# Patient Record
Sex: Female | Born: 2000 | Race: White | Hispanic: No | Marital: Single | Smoking: Never smoker
Health system: Southern US, Community
[De-identification: ages and names within clinical notes are randomized; demographics above are authoritative.]

## PROBLEM LIST (undated history)

## (undated) DIAGNOSIS — C801 Malignant (primary) neoplasm, unspecified: Secondary | ICD-10-CM

---
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
## 2019-04-22 ENCOUNTER — Encounter: Payer: Self-pay | Admitting: Emergency Medicine

## 2019-04-22 ENCOUNTER — Emergency Department
Admission: EM | Admit: 2019-04-22 | Discharge: 2019-04-22 | Disposition: A | Discharge status: Home / Self Care | Payer: Medicaid Other | Attending: Emergency Medicine | Admitting: Emergency Medicine

## 2019-04-22 ENCOUNTER — Other Ambulatory Visit: Payer: Self-pay

## 2019-04-22 ENCOUNTER — Emergency Department: Payer: Medicaid Other

## 2019-04-22 DIAGNOSIS — U071 COVID-19: Secondary | ICD-10-CM | POA: Insufficient documentation

## 2019-04-22 DIAGNOSIS — Z3A09 9 weeks gestation of pregnancy: Secondary | ICD-10-CM | POA: Diagnosis not present

## 2019-04-22 DIAGNOSIS — R300 Dysuria: Secondary | ICD-10-CM | POA: Diagnosis not present

## 2019-04-22 DIAGNOSIS — O2691 Pregnancy related conditions, unspecified, first trimester: Secondary | ICD-10-CM | POA: Diagnosis present

## 2019-04-22 DIAGNOSIS — Z85828 Personal history of other malignant neoplasm of skin: Secondary | ICD-10-CM | POA: Diagnosis not present

## 2019-04-22 DIAGNOSIS — O98511 Other viral diseases complicating pregnancy, first trimester: Secondary | ICD-10-CM | POA: Insufficient documentation

## 2019-04-22 DIAGNOSIS — J069 Acute upper respiratory infection, unspecified: Secondary | ICD-10-CM

## 2019-04-22 DIAGNOSIS — Z3491 Encounter for supervision of normal pregnancy, unspecified, first trimester: Secondary | ICD-10-CM

## 2019-04-22 HISTORY — DX: Malignant (primary) neoplasm, unspecified: C80.1

## 2019-04-22 LAB — HCG, QUANTITATIVE, PREGNANCY: hCG, Beta Chain, Quant, S: 127430 m[IU]/mL — ABNORMAL HIGH (ref <5)

## 2019-04-22 LAB — COMPREHENSIVE METABOLIC PANEL
ALT: 24 U/L (ref 0–44)
AST: 23 U/L (ref 15–41)
Albumin: 4 g/dL (ref 3.5–5.0)
Alkaline Phosphatase: 38 U/L (ref 38–126)
Anion gap: 10 (ref 5–15)
BUN: 11 mg/dL (ref 6–20)
CO2: 22 mmol/L (ref 22–32)
Calcium: 8.9 mg/dL (ref 8.9–10.3)
Chloride: 105 mmol/L (ref 98–111)
Creatinine, Ser: 0.63 mg/dL (ref 0.44–1.00)
GFR calc Af Amer: >60 mL/min (ref >60)
GFR calc non Af Amer: >60 mL/min (ref >60)
Glucose, Bld: 77 mg/dL (ref 70–99)
Potassium: 3.6 mmol/L (ref 3.5–5.1)
Sodium: 137 mmol/L (ref 135–145)
Total Bilirubin: 0.4 mg/dL (ref 0.3–1.2)
Total Protein: 7.3 g/dL (ref 6.5–8.1)

## 2019-04-22 LAB — CBC
HCT: 34.6 % — ABNORMAL LOW (ref 36.0–46.0)
Hemoglobin: 12.3 g/dL (ref 12.0–15.0)
MCH: 29.4 pg (ref 26.0–34.0)
MCHC: 35.5 g/dL (ref 30.0–36.0)
MCV: 82.8 fL (ref 80.0–100.0)
Platelets: 163 10*3/uL (ref 150–400)
RBC: 4.18 MIL/uL (ref 3.87–5.11)
RDW: 12.8 % (ref 11.5–15.5)
WBC: 7.3 10*3/uL (ref 4.0–10.5)
nRBC: 0 % (ref 0.0–0.2)

## 2019-04-22 LAB — URINALYSIS, COMPLETE (UACMP) WITH MICROSCOPIC
Bacteria, UA: NONE SEEN
Bilirubin Urine: NEGATIVE
Glucose, UA: NEGATIVE mg/dL
Hgb urine dipstick: NEGATIVE
Ketones, ur: 20 mg/dL — AB
Leukocytes,Ua: NEGATIVE
Nitrite: NEGATIVE
Protein, ur: NEGATIVE mg/dL
Specific Gravity, Urine: 1.025 (ref 1.005–1.030)
pH: 6 (ref 5.0–8.0)

## 2019-04-22 LAB — INFLUENZA PANEL BY PCR (TYPE A & B)
Influenza A By PCR: NEGATIVE
Influenza B By PCR: NEGATIVE

## 2019-04-22 LAB — POCT PREGNANCY, URINE: Preg Test, Ur: POSITIVE — AB

## 2019-04-22 LAB — GROUP A STREP BY PCR: Group A Strep by PCR: NOT DETECTED

## 2019-04-22 IMAGING — US US OB COMP LESS 14 WK
1 series · 14 of 28 positions shown · non-contrast
Comparison: None.

CLINICAL DATA: Initial evaluation for acute abdominal pain, early
pregnancy.

EXAM:
OBSTETRIC <14 WK ULTRASOUND
TECHNIQUE: Transabdominal ultrasound was performed for evaluation of the
gestation as well as the maternal uterus and adnexal regions.

[Series 1: us ob comp less 14 wk · 14 of 82 slices shown]
[im 4/82]
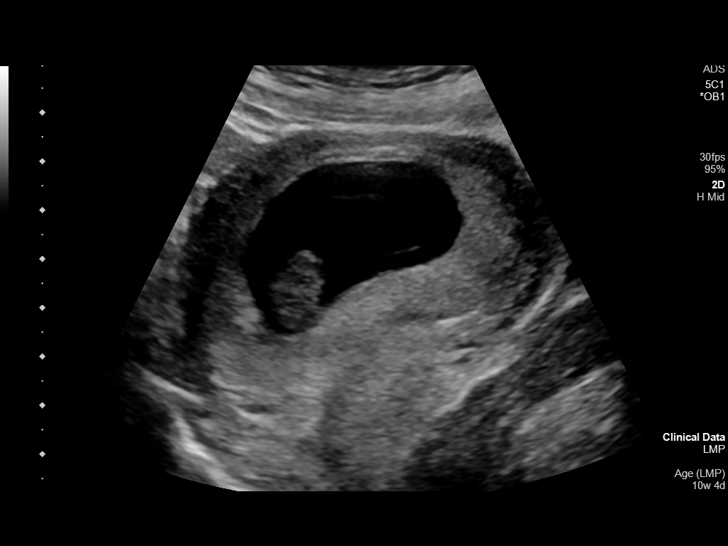
[im 10/82]
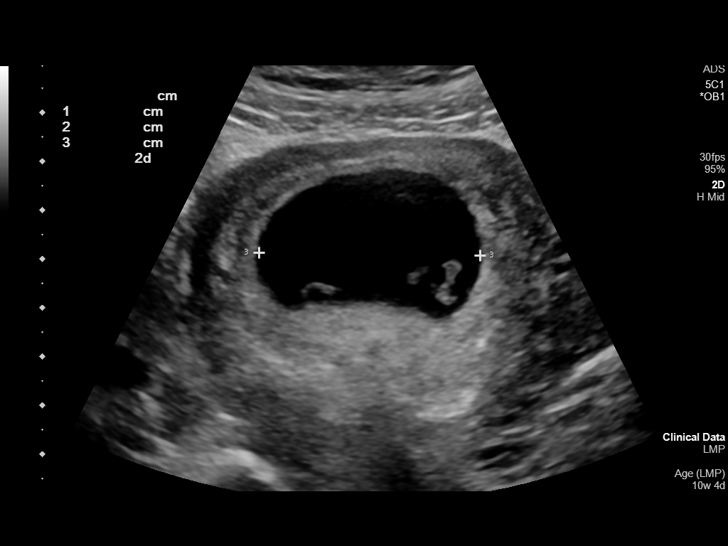
[im 16/82]
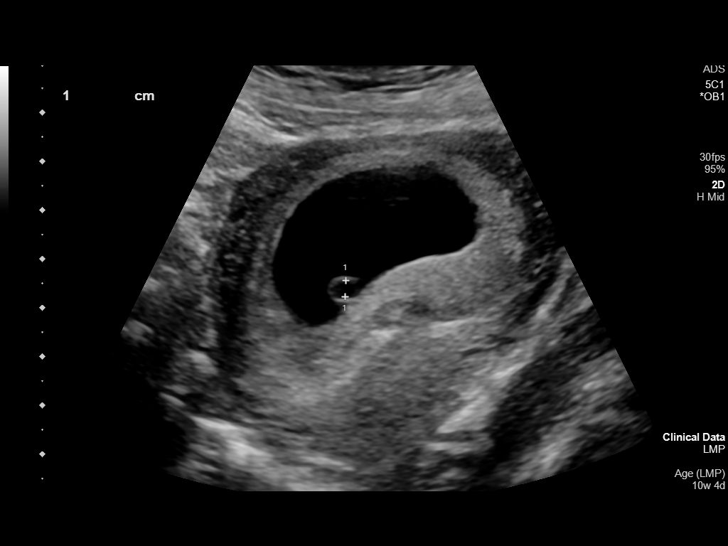
[im 22/82]
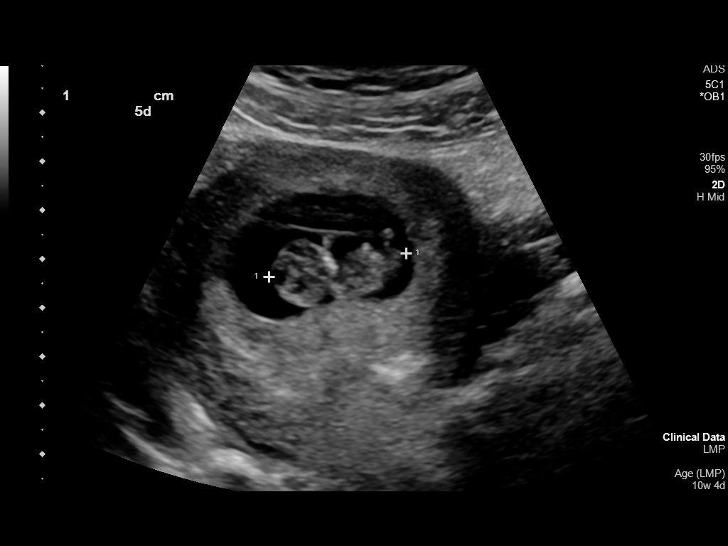
[im 28/82]
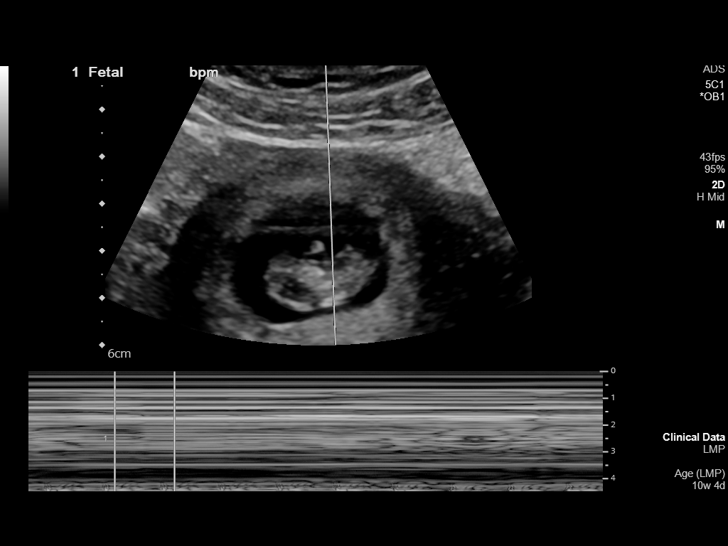
[im 34/82]
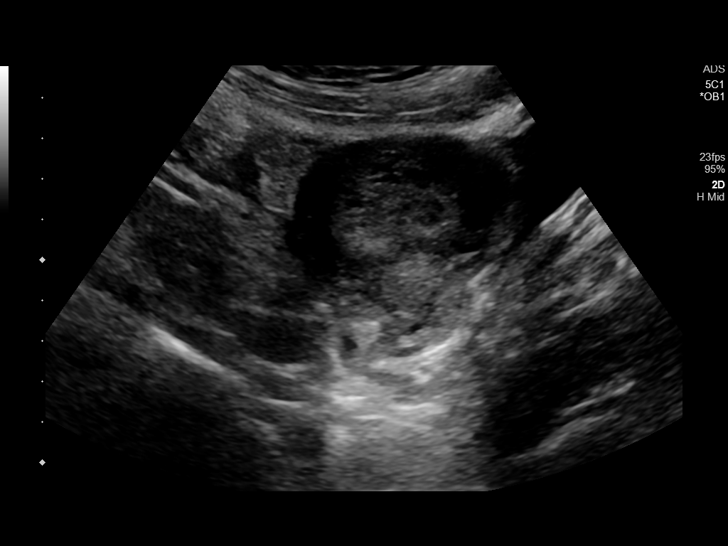
[im 40/82]
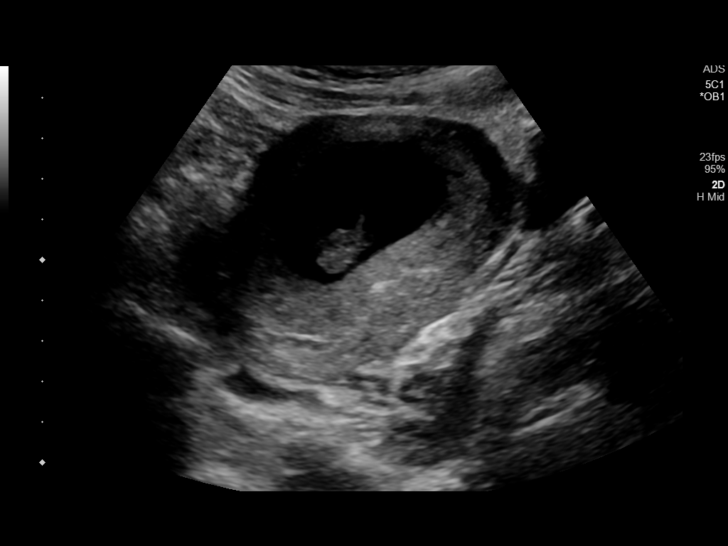
[im 46/82]
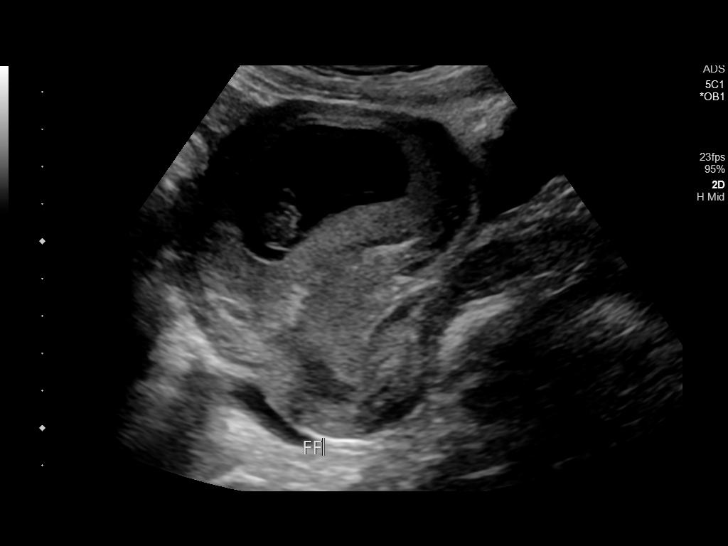
[im 52/82]
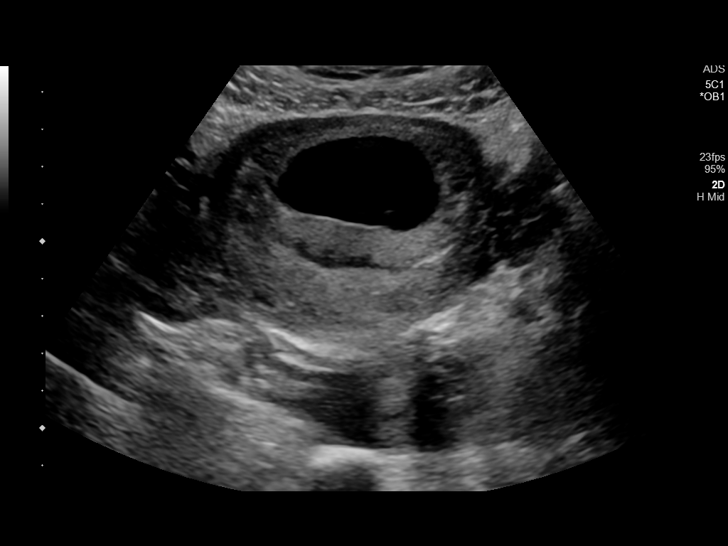
[im 58/82]
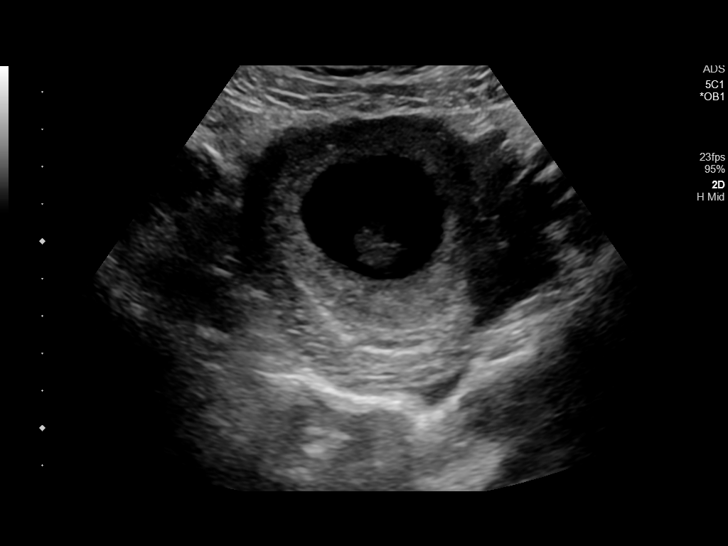
[im 64/82]
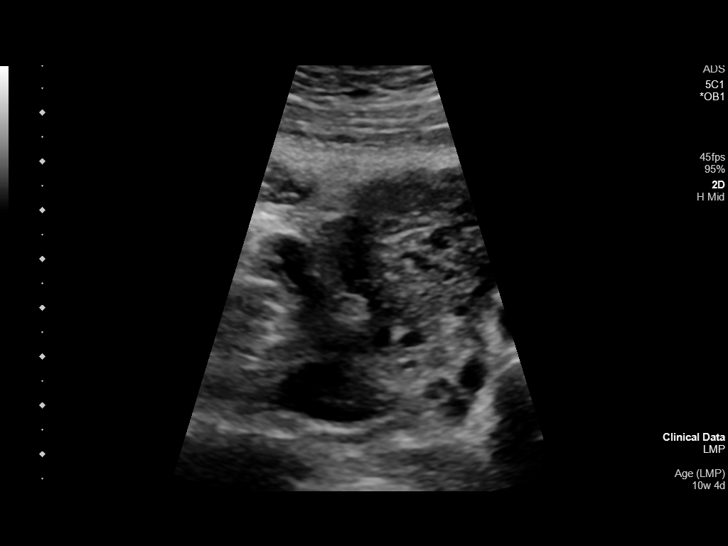
[im 70/82]
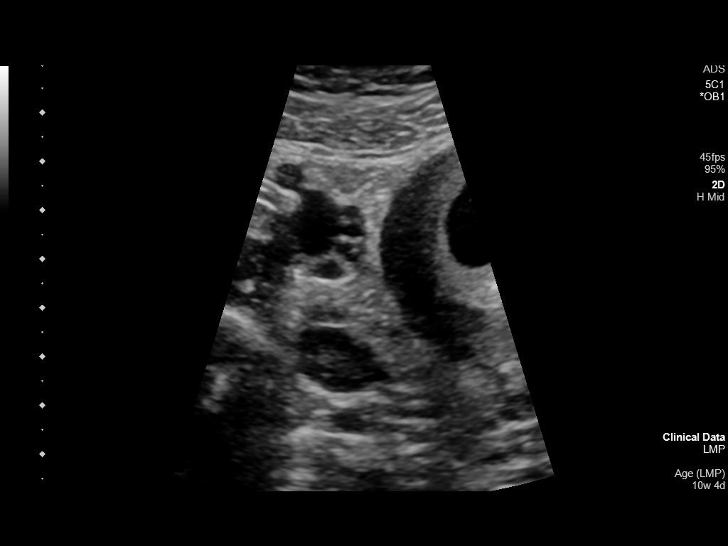
[im 76/82]
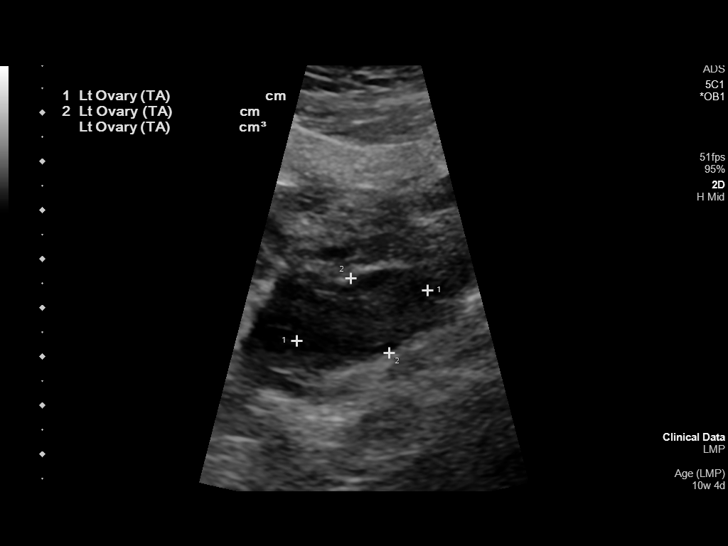
[im 82/82]
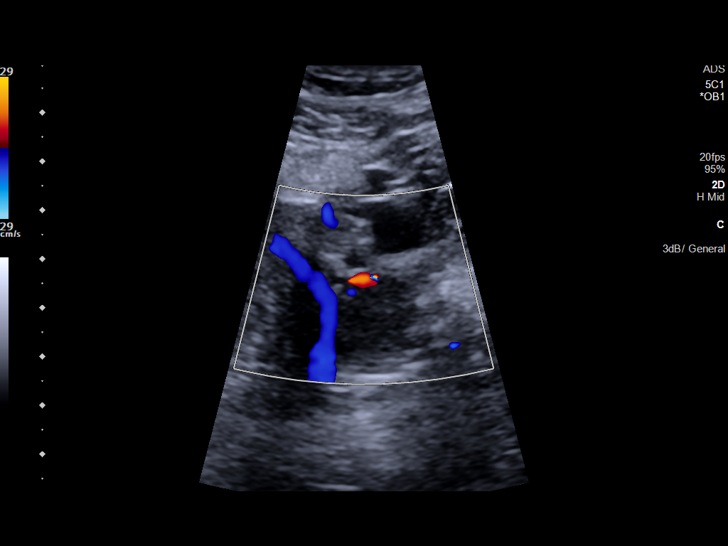

[14 of 28 positions shown; findings below may reference images not displayed]

FINDINGS: Intrauterine gestational sac: Single

Yolk sac:  Present

Embryo:  Present

Cardiac Activity: Present

Heart Rate: 173 bpm

CRL: 28.0 mm   9 w 4 d                  US EDC: [DATE]

Subchorionic hemorrhage:  None visualized.

Maternal uterus/adnexae: Ovaries are normal in appearance
bilaterally. No adnexal mass. Trace free fluid noted within the
pelvis.
IMPRESSION: 1. Single viable intrauterine pregnancy as above, estimated
gestational age 9 weeks and 4 days by crown-rump length, with
ultrasound EDC of [DATE]. No complication.
2. No other acute maternal uterine or adnexal abnormality
identified.

## 2019-04-22 MED ORDER — ACETAMINOPHEN 500 MG PO TABS: 500.0000 mg | ORAL_TABLET | Freq: Four times a day (QID) | ORAL | 0 refills | Status: DC | PRN

## 2019-04-22 NOTE — ED Provider Notes (Signed)
[facility] Emergency Department Provider Note  ____________________________________________  Time seen: Approximately 4:12 PM  I have reviewed the triage vital signs and the nursing notes.   HISTORY  Chief Complaint COVID s/sx    HPI Cynthia Stevenson is a 18 y.o. female that presents to the emergency department for evaluation of nasal congestion and sore throat for 2 days and fever last night with fatigue today.  She feels like she has a cold.  Fever last night was 100.4.  She noticed last night that the soda that she was drinking, she could not taste and that made her concerned that she could have Covid 19 so she came to the emergency department to get tested.  She was going to get tested tomorrow but wanted to get tested sooner.  She did have an episode of abdominal soreness to her right side earlier.  Discomfort was to her side and not over the front of her abdomen.  No additional abdominal pain or cramping.  She has also noticed some dysuria.  Patient states that she is [redacted] weeks pregnant and is scheduled to have her first OB visit on Friday.  She is taking prenatal vitamins.  She took Tylenol prior to coming to the emergency department.  No vaginal bleeding.   Past Medical History:  Diagnosis Date  . Cancer ([place])    melanoma 2016    There are no problems to display for this patient.   History reviewed. No pertinent surgical history.  Prior to Admission medications   Medication Sig Start Date End Date Taking? Authorizing Provider  acetaminophen (TYLENOL) 500 MG tablet Take 1 tablet (500 mg total) by mouth every 6 (six) hours as needed. 04/22/19   Laban Emperor, PA-C    Allergies Patient has no known allergies.  History reviewed. No pertinent family history.  Social History Social History   Tobacco Use  . Smoking status: Never Smoker  . Smokeless tobacco: Never Used  Substance Use Topics  . Alcohol use: Not on file  . Drug use: Not on file      Review of Systems  Constitutional: Positive for low grade fever. Eyes: No visual changes. No discharge. ENT: Positive for congestion and rhinorrhea. Positive for sore throat. Cardiovascular: No chest pain. Respiratory: Positive for intermittent cough. No SOB. Gastrointestinal: No recent abdominal pain.  No nausea, no vomiting.  No diarrhea.  No constipation. Musculoskeletal: Negative for musculoskeletal pain. Skin: Negative for rash, abrasions, lacerations, ecchymosis. Neurological: Negative for headaches.   ____________________________________________   PHYSICAL EXAM:  VITAL SIGNS: ED Triage Vitals  Enc Vitals Group     BP 04/22/19 1515 115/68     Pulse Rate 04/22/19 1515 98     Resp 04/22/19 1515 18     Temp 04/22/19 1515 98.8 F (37.1 C)     Temp Source 04/22/19 1515 Oral     SpO2 04/22/19 1515 99 %     Weight 04/22/19 1514 130 lb (59 kg)     Height 04/22/19 1514 5\' 2"  (1.575 m)     Head Circumference --      Peak Flow --      Pain Score 04/22/19 1516 0     Pain Loc --      Pain Edu? --      Excl. in [place]? --      Constitutional: Alert and oriented. Well appearing and in no acute distress. Eyes: Conjunctivae are normal. PERRL. EOMI. No discharge. Head: Atraumatic. ENT: No frontal and maxillary  sinus tenderness.      Ears: Tympanic membranes pearly gray with good landmarks. No discharge.      Nose: Mild congestion/rhinnorhea.      Mouth/Throat: Mucous membranes are moist. Oropharynx non-erythematous. Tonsils not enlarged. No exudates. Uvula midline. Neck: No stridor.   Hematological/Lymphatic/Immunilogical: No cervical lymphadenopathy. Cardiovascular: Normal rate, regular rhythm.  Good peripheral circulation. Respiratory: Normal respiratory effort without tachypnea or retractions. Lungs CTAB. Good air entry to the bases with no decreased or absent breath sounds. Gastrointestinal: Bowel sounds 4 quadrants. Soft and nontender to palpation. No guarding or  rigidity. No palpable masses. No distention. Musculoskeletal: Full range of motion to all extremities. No gross deformities appreciated. Neurologic:  Normal speech and language. No gross focal neurologic deficits are appreciated.  Skin:  Skin is warm, dry and intact. No rash noted. Psychiatric: Mood and affect are normal. Speech and behavior are normal. Patient exhibits appropriate insight and judgement.   ____________________________________________   LABS (all labs ordered are listed, but only abnormal results are displayed)  Labs Reviewed  CBC - Abnormal; Notable for the following components:      Result Value   HCT 34.6 (*)    All other components within normal limits  HCG, QUANTITATIVE, PREGNANCY - Abnormal; Notable for the following components:   hCG, Beta Chain, Quant, S 127,430 (*)    All other components within normal limits  URINALYSIS, COMPLETE (UACMP) WITH MICROSCOPIC - Abnormal; Notable for the following components:   Color, Urine YELLOW (*)    APPearance CLEAR (*)    Ketones, ur 20 (*)    All other components within normal limits  POCT PREGNANCY, URINE - Abnormal; Notable for the following components:   Preg Test, Ur POSITIVE (*)    All other components within normal limits  GROUP A STREP BY PCR  SARS CORONAVIRUS 2 (TAT 6-24 HRS)  URINE CULTURE  COMPREHENSIVE METABOLIC PANEL  INFLUENZA PANEL BY PCR (TYPE A & B)  POC SARS CORONAVIRUS 2 AG -  ED   ____________________________________________  EKG   ____________________________________________  RADIOLOGY   US OB Comp Less 14 Wks  Result Date: 04/22/2019 CLINICAL DATA:  Initial evaluation for acute abdominal pain, early pregnancy. EXAM: OBSTETRIC <14 WK ULTRASOUND TECHNIQUE: Transabdominal ultrasound was performed for evaluation of the gestation as well as the maternal uterus and adnexal regions. COMPARISON:  None. FINDINGS: Intrauterine gestational sac: Single Yolk sac:  Present Embryo:  Present Cardiac  Activity: Present Heart Rate: 173 bpm CRL: 28.0 mm   9 w 4 d                  [place] EDC: 11/21/2019 Subchorionic hemorrhage:  None visualized. Maternal uterus/adnexae: Ovaries are normal in appearance bilaterally. No adnexal mass. Trace free fluid noted within the pelvis. IMPRESSION: 1. Single viable intrauterine pregnancy as above, estimated gestational age [redacted] weeks and 4 days by crown-rump length, with ultrasound EDC of 11/21/2019. No complication. 2. No other acute maternal uterine or adnexal abnormality identified. Electronically Signed   By: Jeannine Boga M.D.   On: 04/22/2019 18:40    ____________________________________________    PROCEDURES  Procedure(s) performed:    Procedures    Medications - No data to display   ____________________________________________   INITIAL IMPRESSION / ASSESSMENT AND PLAN / ED COURSE  Pertinent labs & imaging results that were available during my care of the patient were reviewed by me and considered in my medical decision making (see chart for details).  Review of the [place] CSRS  was performed in accordance of the [place] prior to dispensing any controlled drugs.   Patient's diagnosis is consistent with viral illness. Vital signs and exam are reassuring.  Triage note states that patient had a temperature of 101.5 but patient clarifies that she did not have a temperature this high and highest that she is aware of was 100.4.  POC Covid test is negative.  Covid PCR test is pending.  Strep is negative. Influenza test is negative. CBC and CMP is largely unremarkable.  No infection on urinalysis.  Beta hCG 127,430. Ultrasound consistent with single viable intrauterine pregnancy with gestational age [redacted] weeks and 4 days without complication. HR 173bpm.  Symptoms are consistent with a viral URI.  Patient appears well and is staying well hydrated.  Patient can continue Tylenol for fever.  Patient feels comfortable going home. Patient will be discharged home with  prescriptions for Tylenol. Patient is to follow up with gynecology as needed or otherwise directed. Patient is given ED precautions to return to the ED for any worsening or new symptoms.  Patient's PCR Covid resulted the next morning as positive.  I have attempted to call the patient twice today without response.  Shaniese Cascino was evaluated in Emergency Department on 04/22/2019 for the symptoms described in the history of present illness. She was evaluated in the context of the global COVID-19 pandemic, which necessitated consideration that the patient might be at risk for infection with the SARS-CoV-2 virus that causes COVID-19. Institutional protocols and algorithms that pertain to the evaluation of patients at risk for COVID-19 are in a state of rapid change based on information released by regulatory bodies including the CDC and federal and state organizations. These policies and algorithms were followed during the patient's care in the ED.   ____________________________________________  FINAL CLINICAL IMPRESSION(S) / ED DIAGNOSES  Final diagnoses:  First trimester pregnancy  Viral URI      NEW MEDICATIONS STARTED DURING THIS VISIT:  ED Discharge Orders         Ordered    acetaminophen (TYLENOL) 500 MG tablet  Every 6 hours PRN     04/22/19 1856              This chart was dictated using voice recognition software/Dragon. Despite best efforts to proofread, errors can occur which can change the meaning. Any change was purely unintentional.    Laban Emperor, PA-C 04/23/19 1459    Vanessa Cusick, MD 04/23/19 1536

## 2019-04-22 NOTE — ED Triage Notes (Signed)
Pt presents to ED via POV c/o temp 101.5 at home, took tylenol 1hr PTA. Temp in triage 98.8. pt reports fatigue, malaise, loss of taste x2 days. States she is [redacted]wks pregnant, was suppose to have first OB visit this Friday but canceled d/t COVID symptoms. NAD.

## 2019-04-22 NOTE — Discharge Instructions (Addendum)
Your ultrasound shows a normal intrauterine pregnancy without complication.  There is no infection on your urine.  Your lab work is all reassuring.  Your strep test is negative.  Your initial Covid test was negative but final Covid results should be available tomorrow.  You likely have a viral cold.  Please return to the emergency department for high fever, shortness of breath, chest pain, abdominal pain, vaginal bleeding or any other symptoms concerning to you.  Please follow-up with your OB appointment on Friday.

## 2019-04-22 NOTE — ED Notes (Signed)
Unable to find fetal heart tones. EDP [place] notified. Pt to go to [place]

## 2019-04-22 NOTE — ED Notes (Signed)
covid negative poct swab.

## 2019-04-22 NOTE — ED Notes (Signed)

## 2019-04-22 NOTE — ED Notes (Signed)
2nd swab sent for flu

## 2019-04-22 NOTE — ED Notes (Signed)
[place] notified hcg quant drawn and sent

## 2019-04-23 LAB — URINE CULTURE: Culture: NO GROWTH

## 2019-04-23 LAB — SARS CORONAVIRUS 2 (TAT 6-24 HRS): SARS Coronavirus 2: POSITIVE — AB

## 2019-04-24 ENCOUNTER — Telehealth: Payer: Self-pay | Admitting: Emergency Medicine

## 2019-04-24 NOTE — Telephone Encounter (Signed)
Called patient to inform of positive covid 19 result.  Explained cdc guidelines for isolation and quarantine.

## 2019-04-25 ENCOUNTER — Encounter: Payer: Self-pay | Admitting: Advanced Practice Midwife

## 2019-04-28 ENCOUNTER — Telehealth: Payer: Self-pay

## 2019-04-28 NOTE — Telephone Encounter (Signed)
Covid test was positive on 04/22/2019 according to [facility] labs.  Let her know, and someone from hospital should be letting her know if not already.  Stay home and avoid exposing others for 10 days starting from the 15th. Ultrasound was done on the 15th as well and does not need repeating as of yet.  She does need additional prenatal labs to be done at her next appt here.

## 2019-04-28 NOTE — Telephone Encounter (Signed)
Pt is scheduled on 05/15/2019 NOB with [facility]. She was recently seen in the ER for COVID. Lab work was done at that time. Pt wanting to know if she can get a call with results and also, since labs done can she go ahead and get an ultrasound scheduled?

## 2019-04-28 NOTE — Telephone Encounter (Signed)
Pt aware.

## 2019-05-16 ENCOUNTER — Telehealth: Payer: Self-pay | Admitting: Obstetrics & Gynecology

## 2019-05-16 ENCOUNTER — Ambulatory Visit (INDEPENDENT_AMBULATORY_CARE_PROVIDER_SITE_OTHER): Payer: Medicaid Other | Admitting: Obstetrics & Gynecology

## 2019-05-16 ENCOUNTER — Other Ambulatory Visit (HOSPITAL_COMMUNITY)
Admission: RE | Admit: 2019-05-16 | Discharge: 2019-05-16 | Disposition: A | Discharge status: Home / Self Care | Payer: Medicaid Other | Source: Ambulatory Visit | Attending: Obstetrics & Gynecology | Admitting: Obstetrics & Gynecology

## 2019-05-16 ENCOUNTER — Encounter: Payer: Self-pay | Admitting: Obstetrics & Gynecology

## 2019-05-16 ENCOUNTER — Other Ambulatory Visit: Payer: Self-pay

## 2019-05-16 VITALS — BP 120/80 | Wt 127.0 lb

## 2019-05-16 DIAGNOSIS — Z113 Encounter for screening for infections with a predominantly sexual mode of transmission: Secondary | ICD-10-CM

## 2019-05-16 DIAGNOSIS — Z3490 Encounter for supervision of normal pregnancy, unspecified, unspecified trimester: Secondary | ICD-10-CM | POA: Insufficient documentation

## 2019-05-16 DIAGNOSIS — Z3A13 13 weeks gestation of pregnancy: Secondary | ICD-10-CM | POA: Diagnosis not present

## 2019-05-16 DIAGNOSIS — Z3401 Encounter for supervision of normal first pregnancy, first trimester: Secondary | ICD-10-CM

## 2019-05-16 DIAGNOSIS — Z3402 Encounter for supervision of normal first pregnancy, second trimester: Secondary | ICD-10-CM | POA: Insufficient documentation

## 2019-05-16 DIAGNOSIS — Z1379 Encounter for other screening for genetic and chromosomal anomalies: Secondary | ICD-10-CM

## 2019-05-16 NOTE — Telephone Encounter (Signed)
When patients materniti 19 lab work comes back please call Ices Kamerer (pts mom) at [phone].

## 2019-05-16 NOTE — Patient Instructions (Signed)
Commonly Asked Questions During Pregnancy  Cats: A parasite can be excreted in cat feces.  To avoid exposure you need to have another person empty the little box.  If you must empty the litter box you will need to wear gloves.  Wash your hands after handling your cat.  This parasite can also be found in raw or undercooked meat so this should also be avoided.  Colds, Sore Throats, Flu: Please check your medication sheet to see what you can take for symptoms.  If your symptoms are unrelieved by these medications please call the office.  Dental Work: Most any dental work your dentist recommends is permitted.  X-rays should only be taken during the first trimester if absolutely necessary.  Your abdomen should be shielded with a lead apron during all x-rays.  Please notify your provider prior to receiving any x-rays.  Novocaine is fine; gas is not recommended.  If your dentist requires a note from us prior to dental work please call the office and we will provide one for you.  Exercise: Exercise is an important part of staying healthy during your pregnancy.  You may continue most exercises you were accustomed to prior to pregnancy.  Later in your pregnancy you will most likely notice you have difficulty with activities requiring balance like riding a bicycle.  It is important that you listen to your body and avoid activities that put you at a higher risk of falling.  Adequate rest and staying well hydrated are a must!  If you have questions about the safety of specific activities ask your provider.    Exposure to Children with illness: Try to avoid obvious exposure; report any symptoms to us when noted,  If you have chicken pos, red measles or mumps, you should be immune to these diseases.   Please do not take any vaccines while pregnant unless you have checked with your OB provider.  Fetal Movement: After 28 weeks we recommend you do "kick counts" twice daily.  Lie or sit down in a calm quiet environment and  count your baby movements "kicks".  You should feel your baby at least 10 times per hour.  If you have not felt 10 kicks within the first hour get up, walk around and have something sweet to eat or drink then repeat for an additional hour.  If count remains less than 10 per hour notify your provider.  Fumigating: Follow your pest control agent's advice as to how long to stay out of your home.  Ventilate the area well before re-entering.  Hemorrhoids:   Most over-the-counter preparations can be used during pregnancy.  Check your medication to see what is safe to use.  It is important to use a stool softener or fiber in your diet and to drink lots of liquids.  If hemorrhoids seem to be getting worse please call the office.   Hot Tubs:  Hot tubs Jacuzzis and saunas are not recommended while pregnant.  These increase your internal body temperature and should be avoided.  Intercourse:  Sexual intercourse is safe during pregnancy as long as you are comfortable, unless otherwise advised by your provider.  Spotting may occur after intercourse; report any bright red bleeding that is heavier than spotting.  Labor:  If you know that you are in labor, please go to the hospital.  If you are unsure, please call the office and let us help you decide what to do.  Lifting, straining, etc:  If your job requires heavy   lifting or straining please check with your provider for any limitations.  Generally, you should not lift items heavier than that you can lift simply with your hands and arms (no back muscles)  Painting:  Paint fumes do not harm your pregnancy, but may make you ill and should be avoided if possible.  Latex or water based paints have less odor than oils.  Use adequate ventilation while painting.  Permanents & Hair Color:  Chemicals in hair dyes are not recommended as they cause increase hair dryness which can increase hair loss during pregnancy.  " Highlighting" and permanents are allowed.  Dye may be  absorbed differently and permanents may not hold as well during pregnancy.  Sunbathing:  Use a sunscreen, as skin burns easily during pregnancy.  Drink plenty of fluids; avoid over heating.  Tanning Beds:  Because their possible side effects are still unknown, tanning beds are not recommended.  Ultrasound Scans:  Routine ultrasounds are performed at approximately 20 weeks.  You will be able to see your baby's general anatomy an if you would like to know the gender this can usually be determined as well.  If it is questionable when you conceived you may also receive an ultrasound early in your pregnancy for dating purposes.  Otherwise ultrasound exams are not routinely performed unless there is a medical necessity.  Although you can request a scan we ask that you pay for it when conducted because insurance does not cover " patient request" scans.  Work: If your pregnancy proceeds without complications you may work until your due date, unless your physician or employer advises otherwise.  Round Ligament Pain/Pelvic Discomfort:  Sharp, shooting pains not associated with bleeding are fairly common, usually occurring in the second trimester of pregnancy.  They tend to be worse when standing up or when you remain standing for long periods of time.  These are the result of pressure of certain pelvic ligaments called "round ligaments".  Rest, Tylenol and heat seem to be the most effective relief.  As the womb and fetus grow, they rise out of the pelvis and the discomfort improves.  Please notify the office if your pain seems different than that described.  It may represent a more serious condition.    First Trimester of Pregnancy The first trimester of pregnancy is from week 1 until the end of week 13 (months 1 through 3). A week after a sperm fertilizes an egg, the egg will implant on the wall of the uterus. This embryo will begin to develop into a baby. Genes from you and your partner will form the baby.  The female genes will determine whether the baby will be a boy or a girl. At 6-8 weeks, the eyes and face will be formed, and the heartbeat can be seen on ultrasound. At the end of 12 weeks, all the baby's organs will be formed. Now that you are pregnant, you will want to do everything you can to have a healthy baby. Two of the most important things are to get good prenatal care and to follow your health care provider's instructions. Prenatal care is all the medical care you receive before the baby's birth. This care will help prevent, find, and treat any problems during the pregnancy and childbirth. Body changes during your first trimester Your body goes through many changes during pregnancy. The changes vary from woman to woman.  You may gain or lose a couple of pounds at first.  You may feel sick to  your stomach (nauseous) and you may throw up (vomit). If the vomiting is uncontrollable, call your health care provider.  You may tire easily.  You may develop headaches that can be relieved by medicines. All medicines should be approved by your health care provider.  You may urinate more often. Painful urination may mean you have a bladder infection.  You may develop heartburn as a result of your pregnancy.  You may develop constipation because certain hormones are causing the muscles that push stool through your intestines to slow down.  You may develop hemorrhoids or swollen veins (varicose veins).  Your breasts may begin to grow larger and become tender. Your nipples may stick out more, and the tissue that surrounds them (areola) may become darker.  Your gums may bleed and may be sensitive to brushing and flossing.  Dark spots or blotches (chloasma, mask of pregnancy) may develop on your face. This will likely fade after the baby is born.  Your menstrual periods will stop.  You may have a loss of appetite.  You may develop cravings for certain kinds of food.  You may have changes in  your emotions from day to day, such as being excited to be pregnant or being concerned that something may go wrong with the pregnancy and baby.  You may have more vivid and strange dreams.  You may have changes in your hair. These can include thickening of your hair, rapid growth, and changes in texture. Some women also have hair loss during or after pregnancy, or hair that feels dry or thin. Your hair will most likely return to normal after your baby is born. What to expect at prenatal visits During a routine prenatal visit:  You will be weighed to make sure you and the baby are growing normally.  Your blood pressure will be taken.  Your abdomen will be measured to track your baby's growth.  The fetal heartbeat will be listened to between weeks 10 and 14 of your pregnancy.  Test results from any previous visits will be discussed. Your health care provider may ask you:  How you are feeling.  If you are feeling the baby move.  If you have had any abnormal symptoms, such as leaking fluid, bleeding, severe headaches, or abdominal cramping.  If you are using any tobacco products, including cigarettes, chewing tobacco, and electronic cigarettes.  If you have any questions. Other tests that may be performed during your first trimester include:  Blood tests to find your blood type and to check for the presence of any previous infections. The tests will also be used to check for low iron levels (anemia) and protein on red blood cells (Rh antibodies). Depending on your risk factors, or if you previously had diabetes during pregnancy, you may have tests to check for high blood sugar that affects pregnant women (gestational diabetes).  Urine tests to check for infections, diabetes, or protein in the urine.  An ultrasound to confirm the proper growth and development of the baby.  Fetal screens for spinal cord problems (spina bifida) and Down syndrome.  HIV (human immunodeficiency virus)  testing. Routine prenatal testing includes screening for HIV, unless you choose not to have this test.  You may need other tests to make sure you and the baby are doing well. Follow these instructions at home: Medicines  Follow your health care provider's instructions regarding medicine use. Specific medicines may be either safe or unsafe to take during pregnancy.  Take a prenatal vitamin that  contains at least 600 micrograms (mcg) of folic acid.  If you develop constipation, try taking a stool softener if your health care provider approves. Eating and drinking   Eat a balanced diet that includes fresh fruits and vegetables, whole grains, good sources of protein such as meat, eggs, or tofu, and low-fat dairy. Your health care provider will help you determine the amount of weight gain that is right for you.  Avoid raw meat and uncooked cheese. These carry germs that can cause birth defects in the baby.  Eating four or five small meals rather than three large meals a day may help relieve nausea and vomiting. If you start to feel nauseous, eating a few soda crackers can be helpful. Drinking liquids between meals, instead of during meals, also seems to help ease nausea and vomiting.  Limit foods that are high in fat and processed sugars, such as fried and sweet foods.  To prevent constipation: ? Eat foods that are high in fiber, such as fresh fruits and vegetables, whole grains, and beans. ? Drink enough fluid to keep your urine clear or pale yellow. Activity  Exercise only as directed by your health care provider. Most women can continue their usual exercise routine during pregnancy. Try to exercise for 30 minutes at least 5 days a week. Exercising will help you: ? Control your weight. ? Stay in shape. ? Be prepared for labor and delivery.  Experiencing pain or cramping in the lower abdomen or lower back is a good sign that you should stop exercising. Check with your health care provider  before continuing with normal exercises.  Try to avoid standing for long periods of time. Move your legs often if you must stand in one place for a long time.  Avoid heavy lifting.  Wear low-heeled shoes and practice good posture.  You may continue to have sex unless your health care provider tells you not to. Relieving pain and discomfort  Wear a good support bra to relieve breast tenderness.  Take warm sitz baths to soothe any pain or discomfort caused by hemorrhoids. Use hemorrhoid cream if your health care provider approves.  Rest with your legs elevated if you have leg cramps or low back pain.  If you develop varicose veins in your legs, wear support hose. Elevate your feet for 15 minutes, 3-4 times a day. Limit salt in your diet. Prenatal care  Schedule your prenatal visits by the twelfth week of pregnancy. They are usually scheduled monthly at first, then more often in the last 2 months before delivery.  Write down your questions. Take them to your prenatal visits.  Keep all your prenatal visits as told by your health care provider. This is important. Safety  Wear your seat belt at all times when driving.  Make a list of emergency phone numbers, including numbers for family, friends, the hospital, and police and fire departments. General instructions  Ask your health care provider for a referral to a local prenatal education class. Begin classes no later than the beginning of month 6 of your pregnancy.  Ask for help if you have counseling or nutritional needs during pregnancy. Your health care provider can offer advice or refer you to specialists for help with various needs.  Do not use hot tubs, steam rooms, or saunas.  Do not douche or use tampons or scented sanitary pads.  Do not cross your legs for long periods of time.  Avoid cat litter boxes and soil used by cats. These  carry germs that can cause birth defects in the baby and possibly loss of the fetus by  miscarriage or stillbirth.  Avoid all smoking, herbs, alcohol, and medicines not prescribed by your health care provider. Chemicals in these products affect the formation and growth of the baby.  Do not use any products that contain nicotine or tobacco, such as cigarettes and e-cigarettes. If you need help quitting, ask your health care provider. You may receive counseling support and other resources to help you quit.  Schedule a dentist appointment. At home, brush your teeth with a soft toothbrush and be gentle when you floss. Contact a health care provider if:  You have dizziness.  You have mild pelvic cramps, pelvic pressure, or nagging pain in the abdominal area.  You have persistent nausea, vomiting, or diarrhea.  You have a bad smelling vaginal discharge.  You have pain when you urinate.  You notice increased swelling in your face, hands, legs, or ankles.  You are exposed to fifth disease or chickenpox.  You are exposed to [place] measles (rubella) and have never had it. Get help right away if:  You have a fever.  You are leaking fluid from your vagina.  You have spotting or bleeding from your vagina.  You have severe abdominal cramping or pain.  You have rapid weight gain or loss.  You vomit blood or material that looks like coffee grounds.  You develop a severe headache.  You have shortness of breath.  You have any kind of trauma, such as from a fall or a car accident. Summary  The first trimester of pregnancy is from week 1 until the end of week 13 (months 1 through 3).  Your body goes through many changes during pregnancy. The changes vary from woman to woman.  You will have routine prenatal visits. During those visits, your health care provider will examine you, discuss any test results you may have, and talk with you about how you are feeling. This information is not intended to replace advice given to you by your health care provider. Make sure you  discuss any questions you have with your health care provider. Document Revised: 04/06/2017 Document Reviewed: 04/05/2016 Elsevier Patient Education  2020 [employer].

## 2019-05-16 NOTE — Progress Notes (Signed)
05/16/2019   Chief Complaint: Missed period  Transfer of Care Patient: no  History of Present Illness: Cynthia Stevenson is a 19 y.o. G1P0 [redacted]w[redacted]d based on Patient's last menstrual period was 02/07/2019 (approximate). with an Estimated Date of Delivery: 11/21/19, with the above CC.   Her periods were: regular periods every 28 days She was using no method when she conceived.  She has Positive signs or symptoms of nausea/vomiting of pregnancy. She has Negative signs or symptoms of miscarriage or preterm labor She identifies Negative Zika risk factors for her and her partner On any different medications around the time she conceived/early pregnancy: No  History of varicella: Yes   ROS: A 12-point review of systems was performed and negative, except as stated in the above HPI.  OBGYN History: As per HPI. OB History  Gravida Para Term Preterm AB Living  1            SAB TAB Ectopic Multiple Live Births               # Outcome Date GA Lbr Len/2nd Weight Sex Delivery Anes PTL Lv  1 Current             Any issues with any prior pregnancies: no Any prior children are healthy, doing well, without any problems or issues: not applicable History of pap smears: No.History of STIs: No   Past Medical History: Past Medical History:  Diagnosis Date  . Cancer ([place])    melanoma 2016    Past Surgical History: Past Surgical History:  Procedure Laterality Date  . MELANOMA EXCISION  2017    Family History:  Family History  Problem Relation Age of Onset  . Pancreatic cancer Maternal Grandmother    She denies any female cancers, bleeding or blood clotting disorders.  She denies any history of mental retardation, birth defects or genetic disorders in her or the FOB's history  Social History:  Social History   Socioeconomic History  . Marital status: Single    Spouse name: Not on file  . Number of children: Not on file  . Years of education: Not on file  . Highest education level: Not on  file  Occupational History  . Not on file  Tobacco Use  . Smoking status: Never Smoker  . Smokeless tobacco: Never Used  Substance and Sexual Activity  . Alcohol use: Never  . Drug use: Never  . Sexual activity: Yes  Other Topics Concern  . Not on file  Social History Narrative  . Not on file   Social Determinants of Health   Financial Resource Strain:   . Difficulty of Paying Living Expenses: Not on file  Food Insecurity:   . Worried About Charity fundraiser in the Last Year: Not on file  . Ran Out of Food in the Last Year: Not on file  Transportation Needs:   . Lack of Transportation (Medical): Not on file  . Lack of Transportation (Non-Medical): Not on file  Physical Activity:   . Days of Exercise per Week: Not on file  . Minutes of Exercise per Session: Not on file  Stress:   . Feeling of Stress : Not on file  Social Connections:   . Frequency of Communication with Friends and Family: Not on file  . Frequency of Social Gatherings with Friends and Family: Not on file  . Attends Religious Services: Not on file  . Active Member of Clubs or Organizations: Not on file  . Attends Club  or Organization Meetings: Not on file  . Marital Status: Not on file  Intimate Partner Violence:   . Fear of Current or Ex-Partner: Not on file  . Emotionally Abused: Not on file  . Physically Abused: Not on file  . Sexually Abused: Not on file   Any pets in the household: no  Allergy: No Known Allergies  Current Outpatient Medications:  Current Outpatient Medications:  .  acetaminophen (TYLENOL) 500 MG tablet, Take 1 tablet (500 mg total) by mouth every 6 (six) hours as needed. (Patient not taking: Reported on 05/16/2019), Disp: 30 tablet, Rfl: 0   Physical Exam:   BP 120/80   Wt 127 lb (57.6 kg)   LMP 02/07/2019 (Approximate)   BMI 23.23 kg/m  Body mass index is 23.23 kg/m. Constitutional: Well nourished, well developed female in no acute distress.  Neck:  Supple, normal  appearance, and no thyromegaly  Cardiovascular: S1, S2 normal, no murmur, rub or gallop, regular rate and rhythm Respiratory:  Clear to auscultation bilateral. Normal respiratory effort Abdomen: positive bowel sounds and no masses, hernias; diffusely non tender to palpation, non distended Breasts: breasts appear normal, no suspicious masses, no skin or nipple changes or axillary nodes. Neuro/Psych:  Normal mood and affect.  Skin:  Warm and dry.  Lymphatic:  No inguinal lymphadenopathy.   Pelvic exam: is not limited by body habitus EGBUS: within normal limits, Vagina: within normal limits and with no blood in the vault, Cervix: normal appearing cervix without discharge or lesions, closed/long/high, Uterus:  enlarged: 12 weeks, and Adnexa:  no mass, fullness, tenderness  Assessment: Cynthia Stevenson is a 19 y.o. G1P0 [redacted]w[redacted]d based on Patient's last menstrual period was 02/07/2019 (approximate). with an Estimated Date of Delivery: 11/21/19,  for prenatal care.  Plan:  1) Avoid alcoholic beverages. 2) Patient encouraged not to smoke.  3) Discontinue the use of all non-medicinal drugs and chemicals.  4) Take prenatal vitamins daily.  5) Seatbelt use advised 6) Nutrition, food safety (fish, cheese advisories, and high nitrite foods) and exercise discussed. 7) Hospital and practice style delivering at [facility] discussed  8) Patient is asked about travel to areas at risk for the [place] virus, and counseled to avoid travel and exposure to mosquitoes or sexual partners who may have themselves been exposed to the virus. Testing is discussed, and will be ordered as appropriate.  9) Childbirth classes at [facility] advised 10) Genetic Screening, such as with 1st Trimester Screening, cell free fetal DNA, AFP testing, and Ultrasound, as well as with amniocentesis and CVS as appropriate, is discussed with patient. She plans to have genetic testing this pregnancy.  Problem list reviewed and updated.  Barnett Applebaum, MD,  [facility], [facility] 05/16/2019  3:17 PM

## 2019-05-18 LAB — RPR+RH+ABO+RUB AB+AB SCR+CB...
Antibody Screen: NEGATIVE
HIV Screen 4th Generation wRfx: NONREACTIVE
Hematocrit: 38.4 % (ref 34.0–46.6)
Hemoglobin: 12.6 g/dL (ref 11.1–15.9)
Hepatitis B Surface Ag: NEGATIVE
MCH: 29.2 pg (ref 26.6–33.0)
MCHC: 32.8 g/dL (ref 31.5–35.7)
MCV: 89 fL (ref 79–97)
Platelets: 223 10*3/uL (ref 150–450)
RBC: 4.31 x10E6/uL (ref 3.77–5.28)
RDW: 12.8 % (ref 11.7–15.4)
RPR Ser Ql: NONREACTIVE
Rh Factor: POSITIVE
Rubella Antibodies, IGG: 1.64 index (ref >0.99)
Varicella zoster IgG: 644 index (ref >165)
WBC: 11.4 10*3/uL — ABNORMAL HIGH (ref 3.4–10.8)

## 2019-05-18 LAB — URINE CULTURE

## 2019-05-20 LAB — CERVICOVAGINAL ANCILLARY ONLY
Bacterial Vaginitis (gardnerella): NEGATIVE
Candida Glabrata: NEGATIVE
Candida Vaginitis: NEGATIVE
Chlamydia: NEGATIVE
Comment: NEGATIVE
Comment: NEGATIVE
Comment: NEGATIVE
Comment: NEGATIVE
Comment: NEGATIVE
Comment: NORMAL
Neisseria Gonorrhea: NEGATIVE
Trichomonas: NEGATIVE

## 2019-05-23 LAB — MATERNIT21 PLUS CORE+SCA
Fetal Fraction: 9
Monosomy X (Turner Syndrome): NOT DETECTED
Result (T21): NEGATIVE
Trisomy 13 (Patau syndrome): NEGATIVE
Trisomy 18 (Edwards syndrome): NEGATIVE
Trisomy 21 (Down syndrome): NEGATIVE
XXX (Triple X Syndrome): NOT DETECTED
XXY (Klinefelter Syndrome): NOT DETECTED
XYY (Jacobs Syndrome): NOT DETECTED

## 2019-06-06 ENCOUNTER — Other Ambulatory Visit: Payer: Self-pay

## 2019-06-06 ENCOUNTER — Ambulatory Visit (INDEPENDENT_AMBULATORY_CARE_PROVIDER_SITE_OTHER): Payer: Medicaid Other | Admitting: Obstetrics and Gynecology

## 2019-06-06 VITALS — BP 112/54 | Wt 127.0 lb

## 2019-06-06 DIAGNOSIS — Z3A16 16 weeks gestation of pregnancy: Secondary | ICD-10-CM

## 2019-06-06 DIAGNOSIS — Z31438 Encounter for other genetic testing of female for procreative management: Secondary | ICD-10-CM

## 2019-06-06 DIAGNOSIS — Z363 Encounter for antenatal screening for malformations: Secondary | ICD-10-CM

## 2019-06-06 DIAGNOSIS — Z3490 Encounter for supervision of normal pregnancy, unspecified, unspecified trimester: Secondary | ICD-10-CM

## 2019-06-06 DIAGNOSIS — Z1379 Encounter for other screening for genetic and chromosomal anomalies: Secondary | ICD-10-CM

## 2019-06-06 LAB — POCT URINALYSIS DIPSTICK OB
Glucose, UA: NEGATIVE
POC,PROTEIN,UA: NEGATIVE

## 2019-06-06 NOTE — Progress Notes (Signed)
ROB

## 2019-06-06 NOTE — Progress Notes (Signed)
Routine Prenatal Care Visit  Subjective  Cynthia Stevenson is a 19 y.o. G1P0 at [redacted]w[redacted]d being seen today for ongoing prenatal care.  She is currently monitored for the following issues for this low-risk pregnancy and has Encounter for supervision of low-risk pregnancy, antepartum on their problem list.  ----------------------------------------------------------------------------------- Patient reports no complaints.   Contractions: Not present. Vag. Bleeding: None.  Movement: Present. Denies leaking of fluid.  ----------------------------------------------------------------------------------- The following portions of the patient's history were reviewed and updated as appropriate: allergies, current medications, past family history, past medical history, past social history, past surgical history and problem list. Problem list updated.   Objective  Blood pressure (!) 112/54, weight 127 lb (57.6 kg), last menstrual period 02/07/2019. Pregravid weight 115 lb (52.2 kg) Total Weight Gain 12 lb (5.443 kg) Urinalysis:      Fetal Status: Fetal Heart Rate (bpm): 150   Movement: Present     General:  Alert, oriented and cooperative. Patient is in no acute distress.  Skin: Skin is warm and dry. No rash noted.   Cardiovascular: Normal heart rate noted  Respiratory: Normal respiratory effort, no problems with respiration noted  Abdomen: Soft, gravid, appropriate for gestational age. Pain/Pressure: Absent     Pelvic:  Cervical exam deferred        Extremities: Normal range of motion.     ental Status: Normal mood and affect. Normal behavior. Normal judgment and thought content.     Assessment   19 y.o. G1P0 at [redacted]w[redacted]d by  11/21/2019, by Ultrasound presenting for routine prenatal visit  Plan   pregnancy 1  Problems (from 02/07/19 to present)    Problem Noted Resolved   Encounter for supervision of low-risk pregnancy, antepartum 05/16/2019 by Gae Dry, MD No   Overview Signed 06/06/2019   3:44 PM by [name], MD    [facility]inic [facility] Prenatal Labs  Dating 9 week [place] Blood type: A/Positive/-- (01/08 1520)   Genetic Screen NIPS: normal XY Antibody:Negative (01/08 1520)  Anatomic [place]  Rubella: 1.64 (01/08 1520) Varicella: Immune  GTT Early:               Third trimester:  RPR: Non Reactive (01/08 1520)   Rhogam  HBsAg: Negative (01/08 1520)   TDaP vaccine                       Flu Shot: HIV: Non Reactive (01/08 1520)   Baby Food                                GBS:   Contraception  Pap: N/A  CBB     CS/VBAC    Support Person               Discussed carrier testing today   1. Cystic Fibrosis Carrier Screen1 (Blood Test): The most common autosomal recessive disorder. This disease causes thick mucus to build up in the lungs, which leads to repetitive chest infections. The carrier frequency in caucasians is roughly 1 in 30 people in the [place], but varies by ethnicity.   2. Spinal Muscular Atrophy Carrier Screen1 (Blood Test):  The second most common autosomal recessive disorder and the most common inherited form of early childhood death. This is degenerative neuromuscular condition that affects the child's ability to sit, smile, breath, swallow, etc. The carrier frequency is the [place] is roughly 1 in 23, but varies by  ethnicity. 3. Fragile X Syndrome Carrier Screen1 (Blood Test): The most common form of inherited mental retardation. The severity of the retardation varies. Fragile X is most commonly passed from mother to child, and 1 in 36 people in the [place] are carriers.  Gestational age appropriate obstetric precautions including but not limited to vaginal bleeding, contractions, leaking of fluid and fetal movement were reviewed in detail with the patient.    Return in about 4 weeks (around 07/04/2019) for ROB and anatomy scan.  [name], MD, [facility], [facility] 06/06/2019, 3:45 PM

## 2019-06-30 ENCOUNTER — Telehealth: Payer: Self-pay

## 2019-06-30 NOTE — Telephone Encounter (Signed)
Mother reports to after hours on call service that patient is [redacted] weeks pregnant & having hip pain & lower abdominal pain. She has cold symptoms (no fever). She was advised to report to L&D. No records found in Epic for L&D visit.

## 2019-06-30 NOTE — Telephone Encounter (Signed)
LMVM TRC. Following up on after hours weekend call. Patient has a Routine f/u apt scheduled this Thursday 07/03/19.

## 2019-07-01 ENCOUNTER — Other Ambulatory Visit: Payer: Self-pay

## 2019-07-01 ENCOUNTER — Encounter: Payer: Self-pay | Admitting: Emergency Medicine

## 2019-07-01 DIAGNOSIS — R509 Fever, unspecified: Secondary | ICD-10-CM | POA: Insufficient documentation

## 2019-07-01 DIAGNOSIS — O98512 Other viral diseases complicating pregnancy, second trimester: Secondary | ICD-10-CM | POA: Insufficient documentation

## 2019-07-01 DIAGNOSIS — O99891 Other specified diseases and conditions complicating pregnancy: Secondary | ICD-10-CM | POA: Diagnosis present

## 2019-07-01 DIAGNOSIS — Z3A19 19 weeks gestation of pregnancy: Secondary | ICD-10-CM | POA: Diagnosis not present

## 2019-07-01 DIAGNOSIS — Z85828 Personal history of other malignant neoplasm of skin: Secondary | ICD-10-CM | POA: Insufficient documentation

## 2019-07-01 DIAGNOSIS — U071 COVID-19: Secondary | ICD-10-CM | POA: Insufficient documentation

## 2019-07-01 LAB — URINALYSIS, COMPLETE (UACMP) WITH MICROSCOPIC
Bilirubin Urine: NEGATIVE
Glucose, UA: NEGATIVE mg/dL
Hgb urine dipstick: NEGATIVE
Ketones, ur: 20 mg/dL — AB
Leukocytes,Ua: NEGATIVE
Nitrite: NEGATIVE
Protein, ur: 30 mg/dL — AB
Specific Gravity, Urine: 1.025 (ref 1.005–1.030)
pH: 6 (ref 5.0–8.0)

## 2019-07-01 LAB — COMPREHENSIVE METABOLIC PANEL
ALT: 16 U/L (ref 0–44)
AST: 21 U/L (ref 15–41)
Albumin: 3.4 g/dL — ABNORMAL LOW (ref 3.5–5.0)
Alkaline Phosphatase: 45 U/L (ref 38–126)
Anion gap: 10 (ref 5–15)
BUN: 10 mg/dL (ref 6–20)
CO2: 22 mmol/L (ref 22–32)
Calcium: 8.2 mg/dL — ABNORMAL LOW (ref 8.9–10.3)
Chloride: 101 mmol/L (ref 98–111)
Creatinine, Ser: 0.59 mg/dL (ref 0.44–1.00)
GFR calc Af Amer: >60 mL/min (ref >60)
GFR calc non Af Amer: >60 mL/min (ref >60)
Glucose, Bld: 85 mg/dL (ref 70–99)
Potassium: 3.4 mmol/L — ABNORMAL LOW (ref 3.5–5.1)
Sodium: 133 mmol/L — ABNORMAL LOW (ref 135–145)
Total Bilirubin: 0.4 mg/dL (ref 0.3–1.2)
Total Protein: 7.1 g/dL (ref 6.5–8.1)

## 2019-07-01 LAB — CBC WITH DIFFERENTIAL/PLATELET
Abs Immature Granulocytes: 0.1 10*3/uL — ABNORMAL HIGH (ref 0.00–0.07)
Basophils Absolute: 0 10*3/uL (ref 0.0–0.1)
Basophils Relative: 0 %
Eosinophils Absolute: 0 10*3/uL (ref 0.0–0.5)
Eosinophils Relative: 0 %
HCT: 31.3 % — ABNORMAL LOW (ref 36.0–46.0)
Hemoglobin: 10.7 g/dL — ABNORMAL LOW (ref 12.0–15.0)
Immature Granulocytes: 1 %
Lymphocytes Relative: 11 %
Lymphs Abs: 1.2 10*3/uL (ref 0.7–4.0)
MCH: 30.1 pg (ref 26.0–34.0)
MCHC: 34.2 g/dL (ref 30.0–36.0)
MCV: 88.2 fL (ref 80.0–100.0)
Monocytes Absolute: 0.8 10*3/uL (ref 0.1–1.0)
Monocytes Relative: 8 %
Neutro Abs: 8.9 10*3/uL — ABNORMAL HIGH (ref 1.7–7.7)
Neutrophils Relative %: 80 %
Platelets: 165 10*3/uL (ref 150–400)
RBC: 3.55 MIL/uL — ABNORMAL LOW (ref 3.87–5.11)
RDW: 13.2 % (ref 11.5–15.5)
WBC: 10.9 10*3/uL — ABNORMAL HIGH (ref 4.0–10.5)
nRBC: 0 % (ref 0.0–0.2)

## 2019-07-01 LAB — LIPASE, BLOOD: Lipase: 18 U/L (ref 11–51)

## 2019-07-01 LAB — HCG, QUANTITATIVE, PREGNANCY: hCG, Beta Chain, Quant, S: 24964 m[IU]/mL — ABNORMAL HIGH (ref <5)

## 2019-07-01 MED ORDER — ACETAMINOPHEN 500 MG PO TABS
1000.0000 mg | ORAL_TABLET | Freq: Once | ORAL | Status: AC
  Administered 2019-07-01: 1000 mg via ORAL
  Filled 2019-07-01: qty 2

## 2019-07-01 NOTE — ED Triage Notes (Addendum)
Patient ambulatory to triage with steady gait, without difficulty or distress noted, mask in place; pt reports approx [redacted]wks pregnant with fever & sore throat since Saturday with lower abd pain; pt at [facility]; FHTs located mid lower abd rate regular at 165

## 2019-07-02 ENCOUNTER — Emergency Department
Admission: EM | Admit: 2019-07-02 | Discharge: 2019-07-02 | Disposition: A | Discharge status: Home / Self Care | Payer: Medicaid Other | Attending: Emergency Medicine | Admitting: Emergency Medicine

## 2019-07-02 DIAGNOSIS — U071 COVID-19: Secondary | ICD-10-CM

## 2019-07-02 LAB — POC SARS CORONAVIRUS 2 AG: SARS Coronavirus 2 Ag: NEGATIVE

## 2019-07-02 LAB — RESPIRATORY PANEL BY RT PCR (FLU A&B, COVID)
Influenza A by PCR: NEGATIVE
Influenza B by PCR: NEGATIVE
SARS Coronavirus 2 by RT PCR: POSITIVE — AB

## 2019-07-02 LAB — GROUP A STREP BY PCR: Group A Strep by PCR: NOT DETECTED

## 2019-07-02 MED ORDER — ACETAMINOPHEN 500 MG PO TABS: 1000.0000 mg | ORAL_TABLET | Freq: Three times a day (TID) | ORAL | 0 refills | Status: DC | PRN

## 2019-07-02 MED ORDER — ONDANSETRON 4 MG PO TBDP: 4.0000 mg | ORAL_TABLET | Freq: Three times a day (TID) | ORAL | 0 refills | Status: DC | PRN

## 2019-07-02 NOTE — ED Notes (Signed)
Date and time results received: 07/02/19 0332 (use smartphrase ".now" to insert current time)  Test: PCR Coronavirus Critical Value: Positive   Name of Provider Notified: Alfred Levins, MD   Orders Received? Or Actions Taken?: N/A

## 2019-07-02 NOTE — ED Provider Notes (Signed)
[facility] Emergency Department Provider Note  ____________________________________________  Time seen: Approximately 1:41 AM  I have reviewed the triage vital signs and the nursing notes.   HISTORY  Chief Complaint Abdominal Pain   HPI Cynthia Stevenson is a 19 y.o. female with history of melanoma and currently [redacted] weeks pregnant per US done in 04/2019 who presents for evaluation of fever and sore throat.   Patient reports 3 days of sore throat, body aches, and fever.  She had Covid back in December and still has loss of taste or smell.  No cough, no chest pain, no shortness of breath, no vomiting, no diarrhea, no vaginal discharge or vaginal bleeding, no dysuria.  Patient reports that she has been having round ligament pain during the last month of her pregnancy.  She has had similar pain although a little bit more intense earlier today that she describes as sharp, bilateral lower pain.  The pain has resolved.  Past Medical History:  Diagnosis Date  . Cancer [facility])    melanoma 2016    Patient Active Problem List   Diagnosis Date Noted  . Encounter for supervision of low-risk pregnancy, antepartum 05/16/2019    Past Surgical History:  Procedure Laterality Date  . MELANOMA EXCISION  2017    Prior to Admission medications   Medication Sig Start Date End Date Taking? Authorizing Provider  acetaminophen (TYLENOL) 500 MG tablet Take 2 tablets (1,000 mg total) by mouth every 8 (eight) hours as needed for mild pain, moderate pain, fever or headache. 07/02/19 07/01/20  Alfred Levins, [place], MD  ondansetron (ZOFRAN ODT) 4 MG disintegrating tablet Take 1 tablet (4 mg total) by mouth every 8 (eight) hours as needed. 07/02/19   Rudene Re, MD    Allergies Patient has no known allergies.  Family History  Problem Relation Age of Onset  . Pancreatic cancer Maternal Grandmother     Social History Social History   Tobacco Use  . Smoking status: Never  Smoker  . Smokeless tobacco: Never Used  Substance Use Topics  . Alcohol use: Never  . Drug use: Never    Review of Systems  Constitutional: + fever, body aches Eyes: Negative for visual changes. ENT: + sore throat. Neck: No neck pain  Cardiovascular: Negative for chest pain. Respiratory: Negative for shortness of breath. Gastrointestinal: + bilateral lower abdominal pain. No vomiting or diarrhea. Genitourinary: Negative for dysuria. Musculoskeletal: Negative for back pain. Skin: Negative for rash. Neurological: Negative for headaches, weakness or numbness. Psych: No SI or HI  ____________________________________________   PHYSICAL EXAM:  VITAL SIGNS: ED Triage Vitals  Enc Vitals Group     BP 07/01/19 2217 111/70     Pulse Rate 07/01/19 2217 (!) 126     Resp 07/01/19 2217 18     Temp 07/01/19 2217 (!) 101.8 F (38.8 C)     Temp Source 07/01/19 2217 Oral     SpO2 07/01/19 2217 100 %     Weight 07/01/19 2216 130 lb (59 kg)     Height 07/01/19 2216 5\' 2"  (1.575 m)     Head Circumference --      Peak Flow --      Pain Score 07/01/19 2216 8     Pain Loc --      Pain Edu? --      Excl. in [place]? --     Constitutional: Alert and oriented. Well appearing and in no apparent distress. HEENT:      Head: Normocephalic  and atraumatic.         Eyes: Conjunctivae are normal. Sclera is non-icteric.       Mouth/Throat: Mucous membranes are moist.  Bilateral tonsils are erythematous and swollen with mild exudates, no evidence of peritonsillar abscess, floor of the mouth is soft, no trismus.      Neck: Supple with no signs of meningismus. Cardiovascular: Tachycardic with regular rhythm. Respiratory: Normal respiratory effort. Lungs are clear to auscultation bilaterally. No wheezes, crackles, or rhonchi.  Gastrointestinal: Gravid, non tender, and non distended with positive bowel sounds. No rebound or guarding. Genitourinary: No CVA tenderness. Musculoskeletal: Nontender with normal  range of motion in all extremities. No edema, cyanosis, or erythema of extremities. Neurologic: Normal speech and language. Face is symmetric. Moving all extremities. No gross focal neurologic deficits are appreciated. Skin: Skin is warm, dry and intact. No rash noted. Psychiatric: Mood and affect are normal. Speech and behavior are normal.  ____________________________________________   LABS (all labs ordered are listed, but only abnormal results are displayed)  Labs Reviewed  RESPIRATORY PANEL BY RT PCR (FLU A&B, COVID) - Abnormal; Notable for the following components:      Result Value   SARS Coronavirus 2 by RT PCR POSITIVE (*)    All other components within normal limits  CBC WITH DIFFERENTIAL/PLATELET - Abnormal; Notable for the following components:   WBC 10.9 (*)    RBC 3.55 (*)    Hemoglobin 10.7 (*)    HCT 31.3 (*)    Neutro Abs 8.9 (*)    Abs Immature Granulocytes 0.10 (*)    All other components within normal limits  COMPREHENSIVE METABOLIC PANEL - Abnormal; Notable for the following components:   Sodium 133 (*)    Potassium 3.4 (*)    Calcium 8.2 (*)    Albumin 3.4 (*)    All other components within normal limits  URINALYSIS, COMPLETE (UACMP) WITH MICROSCOPIC - Abnormal; Notable for the following components:   Color, Urine YELLOW (*)    APPearance CLOUDY (*)    Ketones, ur 20 (*)    Protein, ur 30 (*)    Bacteria, UA FEW (*)    All other components within normal limits  HCG, QUANTITATIVE, PREGNANCY - Abnormal; Notable for the following components:   hCG, Beta Chain, Quant, S 24,964 (*)    All other components within normal limits  GROUP A STREP BY PCR  LIPASE, BLOOD  POC SARS CORONAVIRUS 2 AG -  ED  POC SARS CORONAVIRUS 2 AG   ____________________________________________  EKG  none  ____________________________________________  RADIOLOGY  none  ____________________________________________   PROCEDURES  Procedure(s) performed:  None Procedures Critical Care performed:  None ____________________________________________   INITIAL IMPRESSION / ASSESSMENT AND PLAN / ED COURSE   19 y.o. female with history of melanoma and currently [redacted] weeks pregnant per US done in 04/2019 who presents for evaluation of fever, body aches, and sore throat x 3 days.  Patient is well-appearing but does have a fever of 101.59F, tachycardic with a pulse of 126, bilateral tonsils are enlarged with erythema and mild exudate.  No signs of Ludewig's angina.  Lungs are clear to auscultation patient denies cough or shortness of breath.  Differential diagnosis including strep throat versus Covid versus viral illness.  She has been having round ligament pain throughout her pregnancy.  She has no pain at this time.  Palpation of her abdomen elicits no tenderness.  POCUS showing normal fetal movement with heart rate of 140. Tylenol given  with improvement of pain. Covid and strep swab pending.     _________________________ 3:43 AM on 07/02/2019 -----------------------------------------  Patient tested positive for COVID-19.  Strep negative.  Has no chest pain, no shortness of breath, normal work of breathing, normal sats both at rest and with ambulation with clear lungs.  Discussed quarantine and follow-up with OB/GYN.  Will provide prescriptions for Tylenol extra strength, Zofran for symptom relief.  Discussed oxygen monitoring at home and my standard return precautions.    As part of my medical decision making, I reviewed the following data within the [facility] notes reviewed and incorporated, Labs reviewed , Old chart reviewed, Notes from prior ED visits and [place] Controlled Substance Database   Please note:  Patient was evaluated in Emergency Department today for the symptoms described in the history of present illness. Patient was evaluated in the context of the global COVID-19 pandemic, which necessitated consideration that the  patient might be at risk for infection with the SARS-CoV-2 virus that causes COVID-19. Institutional protocols and algorithms that pertain to the evaluation of patients at risk for COVID-19 are in a state of rapid change based on information released by regulatory bodies including the CDC and federal and state organizations. These policies and algorithms were followed during the patient's care in the ED.  Some ED evaluations and interventions may be delayed as a result of limited staffing during the pandemic.   ____________________________________________   FINAL CLINICAL IMPRESSION(S) / ED DIAGNOSES   Final diagnoses:  COVID-19      NEW MEDICATIONS STARTED DURING THIS VISIT:  ED Discharge Orders         Ordered    ondansetron (ZOFRAN ODT) 4 MG disintegrating tablet  Every 8 hours PRN     07/02/19 0342    acetaminophen (TYLENOL) 500 MG tablet  Every 8 hours PRN     07/02/19 0342           Note:  This document was prepared using Dragon voice recognition software and may include unintentional dictation errors.    Alfred Levins, [place], MD 07/02/19 [phone]

## 2019-07-02 NOTE — Discharge Instructions (Signed)
QUARANTINE INSTRUCTION  Follow these instructions at home:  Protecting others To avoid spreading the illness to other people: Quarantine in your home until you have had no cough and fever for 7 days. Household members should also be quarantine for at least 14 days after being exposed to you. Wash your hands often with soap and water. If soap and water are not available, use an alcohol-based hand sanitizer. If you have not cleaned your hands, do not touch your face. Make sure that all people in your household wash their hands well and often. Cover your nose and mouth when you cough or sneeze. Throw away used tissues. Stay home if you have any cold-like or flu-like symptoms. General instructions Go to your local pharmacy and buy a pulse oximeter (this is a machine that measures your oxygen). Check your oxygen levels at least 3 times a day. If your oxygen level is 92% or less return to the emergency room immediately Take over-the-counter and prescription medicines only as told by your health care provider. If you need medication for fever take tylenol or ibuprofen Drink enough fluid to keep your urine pale yellow. Rest at home as directed by your health care provider. Do not give aspirin to a child with the flu, because of the association with Reye's syndrome. Do not use tobacco products, including cigarettes, chewing tobacco, and e-cigarettes. If you need help quitting, ask your health care provider. Keep all follow-up visits as told by your health care provider. This is important. How is this prevented? Avoid areas where an outbreak has been reported. Avoid large groups of people. Keep a safe distance from people who are coughing and sneezing. Do not touch your face if you have not cleaned your hands. When you are around people who are sick or might be sick, wear a mask to protect yourself. Contact a health care provider if: You have symptoms of SARS (cough, fever, chest pain, shortness of  breath) that are not getting better at home. You have a fever. If you have difficulty breathing go to your local ER or call 911

## 2019-07-03 ENCOUNTER — Encounter: Payer: Medicaid Other | Admitting: Advanced Practice Midwife

## 2019-07-03 ENCOUNTER — Ambulatory Visit: Payer: Medicaid Other

## 2019-07-23 ENCOUNTER — Ambulatory Visit (INDEPENDENT_AMBULATORY_CARE_PROVIDER_SITE_OTHER): Payer: Medicaid Other | Admitting: Advanced Practice Midwife

## 2019-07-23 ENCOUNTER — Ambulatory Visit (INDEPENDENT_AMBULATORY_CARE_PROVIDER_SITE_OTHER): Payer: Medicaid Other

## 2019-07-23 ENCOUNTER — Other Ambulatory Visit: Payer: Self-pay

## 2019-07-23 ENCOUNTER — Encounter: Payer: Self-pay | Admitting: Advanced Practice Midwife

## 2019-07-23 VITALS — BP 118/74 | Wt 133.0 lb

## 2019-07-23 DIAGNOSIS — Z3A22 22 weeks gestation of pregnancy: Secondary | ICD-10-CM | POA: Diagnosis not present

## 2019-07-23 DIAGNOSIS — Z131 Encounter for screening for diabetes mellitus: Secondary | ICD-10-CM

## 2019-07-23 DIAGNOSIS — Z3402 Encounter for supervision of normal first pregnancy, second trimester: Secondary | ICD-10-CM

## 2019-07-23 DIAGNOSIS — Z130 Encounter for screening for diseases of the blood and blood-forming organs and certain disorders involving the immune mechanism: Secondary | ICD-10-CM

## 2019-07-23 DIAGNOSIS — Z3490 Encounter for supervision of normal pregnancy, unspecified, unspecified trimester: Secondary | ICD-10-CM

## 2019-07-23 DIAGNOSIS — Z363 Encounter for antenatal screening for malformations: Secondary | ICD-10-CM | POA: Diagnosis not present

## 2019-07-23 DIAGNOSIS — Z113 Encounter for screening for infections with a predominantly sexual mode of transmission: Secondary | ICD-10-CM

## 2019-07-23 NOTE — Progress Notes (Addendum)
  Routine Prenatal Care Visit  Subjective  Cynthia Stevenson is a 19 y.o. G1P0 at [redacted]w[redacted]d being seen today for ongoing prenatal care.  She is currently monitored for the following issues for this low-risk pregnancy and has Encounter for supervision of low-risk pregnancy, antepartum on their problem list.  ----------------------------------------------------------------------------------- Patient reports no complaints.   Contractions: Not present. Vag. Bleeding: None.  Movement: Present. Leaking Fluid denies.  ----------------------------------------------------------------------------------- The following portions of the patient's history were reviewed and updated as appropriate: allergies, current medications, past family history, past medical history, past social history, past surgical history and problem list. Problem list updated.  Objective  Blood pressure 118/74, weight 133 lb (60.3 kg), last menstrual period 02/07/2019. Pregravid weight 115 lb (52.2 kg) Total Weight Gain 18 lb (8.165 kg) Urinalysis: Urine Protein    Urine Glucose    Fetal Status: Fetal Heart Rate (bpm): 142 Fundal Height: 22 cm Movement: Present  Presentation: Vertex   Anatomy scan: complete, normal, female, placenta anterior  General:  Alert, oriented and cooperative. Patient is in no acute distress.  Skin: Skin is warm and dry. No rash noted.   Cardiovascular: Normal heart rate noted  Respiratory: Normal respiratory effort, no problems with respiration noted  Abdomen: Soft, gravid, appropriate for gestational age. Pain/Pressure: Absent     Pelvic:  Cervical exam deferred        Extremities: Normal range of motion.  Edema: None  Mental Status: Normal mood and affect. Normal behavior. Normal judgment and thought content.   Assessment   18 y.o. G1P0 at [redacted]w[redacted]d by  11/21/2019, by Ultrasound presenting for routine prenatal visit  Plan   pregnancy 1  Problems (from 02/07/19 to present)    Problem Noted Resolved   Encounter for supervision of low-risk pregnancy, antepartum 05/16/2019 by Gae Dry, MD No   Overview Addendum 06/06/2019  3:45 PM by [name], MD    [facility] Prenatal Labs  Dating 9 week [place] Blood type: A/Positive/-- (01/08 1520)   Genetic Screen NIPS: normal XY Antibody:Negative (01/08 1520)  Anatomic [place] Complete, normal 3/17 Rubella: 1.64 (01/08 1520) Varicella: Immune  GTT Early:               Third trimester:  RPR: Non Reactive (01/08 1520)   Rhogam  HBsAg: Negative (01/08 1520)   TDaP vaccine                       Flu Shot: HIV: Non Reactive (01/08 1520)   Baby Food                                GBS:   Contraception  Pap: N/A  CBB     CS/VBAC    Support Person                Preterm labor symptoms and general obstetric precautions including but not limited to vaginal bleeding, contractions, leaking of fluid and fetal movement were reviewed in detail with the patient.   Return in about 4 weeks (around 08/20/2019) for 28 wk labs and rob.  Rod Can, CNM 07/23/2019 4:20 PM

## 2019-07-23 NOTE — Progress Notes (Signed)
Anatomy scan today. No vb. No lof.  ?

## 2019-08-19 ENCOUNTER — Other Ambulatory Visit: Payer: Medicaid Other

## 2019-08-19 ENCOUNTER — Ambulatory Visit (INDEPENDENT_AMBULATORY_CARE_PROVIDER_SITE_OTHER): Payer: Medicaid Other | Admitting: Obstetrics and Gynecology

## 2019-08-19 ENCOUNTER — Other Ambulatory Visit: Payer: Self-pay

## 2019-08-19 VITALS — BP 110/60 | Wt 140.0 lb

## 2019-08-19 DIAGNOSIS — Z3490 Encounter for supervision of normal pregnancy, unspecified, unspecified trimester: Secondary | ICD-10-CM

## 2019-08-19 DIAGNOSIS — Z3492 Encounter for supervision of normal pregnancy, unspecified, second trimester: Secondary | ICD-10-CM

## 2019-08-19 DIAGNOSIS — Z130 Encounter for screening for diseases of the blood and blood-forming organs and certain disorders involving the immune mechanism: Secondary | ICD-10-CM

## 2019-08-19 DIAGNOSIS — Z131 Encounter for screening for diabetes mellitus: Secondary | ICD-10-CM

## 2019-08-19 DIAGNOSIS — Z3A26 26 weeks gestation of pregnancy: Secondary | ICD-10-CM

## 2019-08-19 DIAGNOSIS — Z113 Encounter for screening for infections with a predominantly sexual mode of transmission: Secondary | ICD-10-CM

## 2019-08-19 DIAGNOSIS — M419 Scoliosis, unspecified: Secondary | ICD-10-CM

## 2019-08-19 DIAGNOSIS — G5701 Lesion of sciatic nerve, right lower limb: Secondary | ICD-10-CM

## 2019-08-19 NOTE — Progress Notes (Signed)
ROB 28 week labs 

## 2019-08-19 NOTE — Progress Notes (Signed)
Routine Prenatal Care Visit  Subjective  Cynthia Stevenson is a 19 y.o. G1P0 at [redacted]w[redacted]d being seen today for ongoing prenatal care.  She is currently monitored for the following issues for this low-risk pregnancy and has Encounter for supervision of low-risk pregnancy, antepartum on their problem list.  ----------------------------------------------------------------------------------- Patient reports worsening sciatica.  She has a history of sciatica as well as underlying scoliosis.  She is planning on an unmedicated birth but is interested in meeting with anesthesia to see if she would be candidate for epidural should she change her stance on this. Contractions: Not present. Vag. Bleeding: None.  Movement: Present. Denies leaking of fluid.  ----------------------------------------------------------------------------------- The following portions of the patient's history were reviewed and updated as appropriate: allergies, current medications, past family history, past medical history, past social history, past surgical history and problem list. Problem list updated.   Objective  Blood pressure 110/60, weight 140 lb (63.5 kg), last menstrual period 02/07/2019. Pregravid weight 115 lb (52.2 kg) Total Weight Gain 25 lb (11.3 kg) Urinalysis:      Fetal Status: Fetal Heart Rate (bpm): 150 Fundal Height: 26 cm Movement: Present     General:  Alert, oriented and cooperative. Patient is in no acute distress.  Skin: Skin is warm and Stevenson. No rash noted.   Cardiovascular: Normal heart rate noted  Respiratory: Normal respiratory effort, no problems with respiration noted  Abdomen: Soft, gravid, appropriate for gestational age. Pain/Pressure: Absent     Pelvic:  Cervical exam deferred        Extremities: Normal range of motion.     ental Status: Normal Stevenson and affect. Normal behavior. Normal judgment and thought content.     Assessment   19 y.o. G1P0 at [redacted]w[redacted]d by  11/21/2019, by Ultrasound  presenting for routine prenatal visit  Plan   pregnancy 1  Problems (from 02/07/19 to present)    Problem Noted Resolved   Encounter for supervision of low-risk pregnancy, antepartum 05/16/2019 by Cynthia Dry, MD No   Overview Addendum 07/23/2019  4:23 PM by Cynthia Stevenson, [place] Prenatal Labs  Dating 9 week [place] Blood type: A/Positive/-- (01/08 1520)   Genetic Screen NIPS: normal XY Antibody:Negative (01/08 1520)  Anatomic [place] Complete, normal 3/17 Rubella: 1.64 (01/08 1520) Varicella: Immune  GTT Early:               Third trimester:  RPR: Non Reactive (01/08 1520)   Rhogam  HBsAg: Negative (01/08 1520)   TDaP vaccine                       Flu Shot: HIV: Non Reactive (01/08 1520)   Baby Food                                GBS:   Contraception  Pap: N/A  CBB     CS/VBAC    Support Person                Gestational age appropriate obstetric precautions including but not limited to vaginal bleeding, contractions, leaking of fluid and fetal movement were reviewed in detail with the patient.    Sciatica - chiropracter referal Scoliosis - anesthesia referral   28 week labs today  Return in about 2 weeks (around 09/02/2019) for ROB.  Cynthia Mood, MD, [facility], [facility] 08/19/2019, 3:55 PM

## 2019-08-20 LAB — 28 WEEK RH+PANEL
Basophils Absolute: 0 10*3/uL (ref 0.0–0.2)
Basos: 0 %
EOS (ABSOLUTE): 0.1 10*3/uL (ref 0.0–0.4)
Eos: 1 %
Gestational Diabetes Screen: 111 mg/dL (ref 65–139)
HIV Screen 4th Generation wRfx: NONREACTIVE
Hematocrit: 31.3 % — ABNORMAL LOW (ref 34.0–46.6)
Hemoglobin: 10.6 g/dL — ABNORMAL LOW (ref 11.1–15.9)
Immature Grans (Abs): 0.3 10*3/uL — ABNORMAL HIGH (ref 0.0–0.1)
Immature Granulocytes: 2 %
Lymphocytes Absolute: 2.5 10*3/uL (ref 0.7–3.1)
Lymphs: 19 %
MCH: 29.9 pg (ref 26.6–33.0)
MCHC: 33.9 g/dL (ref 31.5–35.7)
MCV: 88 fL (ref 79–97)
Monocytes Absolute: 0.8 10*3/uL (ref 0.1–0.9)
Monocytes: 6 %
Neutrophils Absolute: 9.7 10*3/uL — ABNORMAL HIGH (ref 1.4–7.0)
Neutrophils: 72 %
Platelets: 224 10*3/uL (ref 150–450)
RBC: 3.54 x10E6/uL — ABNORMAL LOW (ref 3.77–5.28)
RDW: 12.2 % (ref 11.7–15.4)
RPR Ser Ql: NONREACTIVE
WBC: 13.6 10*3/uL — ABNORMAL HIGH (ref 3.4–10.8)

## 2019-08-21 ENCOUNTER — Telehealth: Payer: Self-pay | Admitting: Obstetrics and Gynecology

## 2019-08-21 NOTE — Telephone Encounter (Signed)
Called pt to adv that, per [facility] request, I have set up an appt for an anesthesia consult, 5/21 @ 9:30

## 2019-09-01 ENCOUNTER — Ambulatory Visit (INDEPENDENT_AMBULATORY_CARE_PROVIDER_SITE_OTHER): Payer: Medicaid Other | Admitting: Obstetrics and Gynecology

## 2019-09-01 ENCOUNTER — Other Ambulatory Visit: Payer: Self-pay

## 2019-09-01 VITALS — BP 116/58 | Wt 143.0 lb

## 2019-09-01 DIAGNOSIS — Z3A28 28 weeks gestation of pregnancy: Secondary | ICD-10-CM

## 2019-09-01 DIAGNOSIS — Z3490 Encounter for supervision of normal pregnancy, unspecified, unspecified trimester: Secondary | ICD-10-CM

## 2019-09-01 DIAGNOSIS — Z3493 Encounter for supervision of normal pregnancy, unspecified, third trimester: Secondary | ICD-10-CM

## 2019-09-01 LAB — POCT URINALYSIS DIPSTICK OB
Glucose, UA: NEGATIVE
POC,PROTEIN,UA: NEGATIVE

## 2019-09-01 NOTE — Progress Notes (Signed)
ROB

## 2019-09-01 NOTE — Progress Notes (Signed)
    Routine Prenatal Care Visit  Subjective  Cynthia Stevenson is a 19 y.o. G1P0 at [redacted]w[redacted]d being seen today for ongoing prenatal care.  She is currently monitored for the following issues for this low-risk pregnancy and has Encounter for supervision of low-risk pregnancy, antepartum on their problem list.  ----------------------------------------------------------------------------------- Patient reports no complaints.    .  .   . Denies leaking of fluid.  ----------------------------------------------------------------------------------- The following portions of the patient's history were reviewed and updated as appropriate: allergies, current medications, past family history, past medical history, past social history, past surgical history and problem list. Problem list updated.   Objective  Blood pressure (!) 116/58, weight 143 lb (64.9 kg), last menstrual period 02/07/2019. Pregravid weight 115 lb (52.2 kg) Total Weight Gain 28 lb (12.7 kg) Urinalysis:      Fetal Status:           General:  Alert, oriented and cooperative. Patient is in no acute distress.  Skin: Skin is warm and Stevenson. No rash noted.   Cardiovascular: Normal heart rate noted  Respiratory: Normal respiratory effort, no problems with respiration noted  Abdomen: Soft, gravid, appropriate for gestational age.       Pelvic:  Cervical exam deferred        Extremities: Normal range of motion.     ental Status: Normal Stevenson and affect. Normal behavior. Normal judgment and thought content.     Assessment   19 y.o. G1P0 at [redacted]w[redacted]d by  11/21/2019, by Ultrasound presenting for routine prenatal visit  Plan   pregnancy 1  Problems (from 02/07/19 to present)    Problem Noted Resolved   Encounter for supervision of low-risk pregnancy, antepartum 05/16/2019 by Cynthia Dry, MD No   Overview Addendum 09/01/2019 10:42 AM by Cynthia Mood, MD    Clinic [facility] Prenatal Labs  Dating 9 week [place] Blood type: A/Positive/-- (01/08  1520)   Genetic Screen NIPS: normal XY Antibody:Negative (01/08 1520)  Anatomic [place] Complete, normal 3/17 Rubella: 1.64 (01/08 1520) Varicella: Immune  GTT 111 RPR: Non Reactive (01/08 1520)   Rhogam  HBsAg: Negative (01/08 1520)   TDaP vaccine                       Flu Shot: HIV: Non Reactive (01/08 1520)   Baby Food                                GBS:   Contraception  Pap: N/A  CBB     CS/VBAC    Support Person                Gestational age appropriate obstetric precautions including but not limited to vaginal bleeding, contractions, leaking of fluid and fetal movement were reviewed in detail with the patient.    Return in about 2 weeks (around 09/15/2019) for [place].  Cynthia Mood, MD, [facility], [facility] 09/01/2019, 10:43 AM

## 2019-09-17 ENCOUNTER — Ambulatory Visit (INDEPENDENT_AMBULATORY_CARE_PROVIDER_SITE_OTHER): Payer: Medicaid Other | Admitting: Certified Nurse Midwife

## 2019-09-17 ENCOUNTER — Other Ambulatory Visit: Payer: Self-pay

## 2019-09-17 VITALS — BP 110/60 | Wt 144.0 lb

## 2019-09-17 DIAGNOSIS — Z3493 Encounter for supervision of normal pregnancy, unspecified, third trimester: Secondary | ICD-10-CM

## 2019-09-17 DIAGNOSIS — Z3A30 30 weeks gestation of pregnancy: Secondary | ICD-10-CM

## 2019-09-17 LAB — POCT URINALYSIS DIPSTICK OB
Glucose, UA: NEGATIVE
POC,PROTEIN,UA: NEGATIVE

## 2019-09-17 NOTE — Progress Notes (Signed)
[id number] ROB- no concerns/BT signed today, TDAP is scheduled next week at [facility]

## 2019-09-17 NOTE — Progress Notes (Signed)
ROB at 30wk5d: Doing well. Had some cramping in lower abdomen while doing perineal massage. Resolved with rest. No vaginal bleeding. Baby active. Desires to breast feed. Had first [place] vaccine for [place] on 09/10/19. Second dose scheduled for next month. Will be getting TDAP at [facility]  Exam: 110/60 FH 31cm. FHT WNL  A: IUP at 30wk5d S=D  P: Discussed Readysetbaby online and also virtual childbirth classes Breastfeeding Education:The following were addressed during this visit:  Breastfeeding Education - The importance of early skin-to-skin contact    Comments: Keeps baby warm and secure, helps keep baby's blood sugar up and breathing steady, easier to bond and breastfeed, and helps calm baby.  - Rooming-in on a 24-hour basis    Comments: Easier to learn baby's feeding cues, easier to bond and get to know each other, and encourages milk production.   - Feeding on demand or baby-led feeding    Comments: Helps prevent breastfeeding complications, helps bring in good milk supply, prevents under or overfeeding, and helps baby feel content and satisfied   - Frequent feeding to help assure optimal milk production    Comments: Making a full supply of milk requires frequent removal of milk from breasts, infant will eat 8-12 times in 24 hours, if separated from infant use breast massage, hand expression and/ or pumping to remove milk from breasts.  ROB in 2 weeks  Dalia Heading, CNM

## 2019-09-26 ENCOUNTER — Other Ambulatory Visit: Payer: Self-pay

## 2019-09-26 ENCOUNTER — Encounter
Admission: RE | Admit: 2019-09-26 | Discharge: 2019-09-26 | Disposition: A | Discharge status: Home / Self Care | Payer: Medicaid Other | Source: Ambulatory Visit | Attending: Anesthesiology | Admitting: Anesthesiology

## 2019-09-26 NOTE — Consult Note (Signed)
[facility] Anesthesia Consultation  Cynthia Stevenson [id number] DOB: 2000/10/25 DOA: 09/26/2019 PCP: [facility], [place]   Requesting physician: Dr. Georgianne Fick Date of consultation: 09/26/19 Reason for consultation: Hx of scoliosis  CHIEF COMPLAINT:  Pregnancy  HISTORY OF PRESENT ILLNESS: Cynthia Stevenson  is a 19 y.o. female with a known history of scoliosis. Per patient report she has a 40 deg curve in her upper back. Has not had any spine surgery and was told that as long as she stayed active she shouldn't have any problems. Denies any hx of cardiovascular disease. Denies hx of asthma. Denies personal or family hx of bleeding disorders. This is her first baby.   PAST MEDICAL HISTORY:   Past Medical History:  Diagnosis Date  . Cancer ([place])    melanoma 2016    PAST SURGICAL HISTORY:  Past Surgical History:  Procedure Laterality Date  . MELANOMA EXCISION  2017    SOCIAL HISTORY:  Social History   Tobacco Use  . Smoking status: Never Smoker  . Smokeless tobacco: Never Used  Substance Use Topics  . Alcohol use: Never    FAMILY HISTORY:  Family History  Problem Relation Age of Onset  . Pancreatic cancer Maternal Grandmother     DRUG ALLERGIES: No Known Allergies  REVIEW OF SYSTEMS:   RESPIRATORY: No cough, shortness of breath, wheezing.  CARDIOVASCULAR: No chest pain, orthopnea, edema.  HEMATOLOGY: No anemia, easy bruising or bleeding SKIN: No rash or lesion. NEUROLOGIC: No tingling, numbness, weakness.  PSYCHIATRY: No anxiety or depression.   MEDICATIONS AT HOME:  Prior to Admission medications   Not on File      PHYSICAL EXAMINATION:   VITAL SIGNS: Last menstrual period 02/07/2019.  GENERAL:  19 y.o.-year-old patient no acute distress.  EXTREMITIES: No pedal edema, cyanosis, or clubbing.  NEUROLOGIC: normal gait, no significant curvature to her lumbar spine PSYCHIATRIC: The patient is alert and oriented x 3.   SKIN: No obvious rash, lesion, or ulcer.    IMPRESSION AND PLAN:   Cynthia Stevenson  is a 19 y.o. female presenting at [redacted] weeks gestation with hx of scoliosis.  On my exam there was not any significant curvature to her lumbar spine. Pt is thin and interspinous spaces were easily palpated. I discussed with her that I did not have any concerns about epidural placement.    This is her first baby and she is planning for NCB, but open to the idea of an epidural. We discussed basic failure rates for epidurals as I do not believe her to be at any greater risk for failure than the general population.   We discussed that should a cesarean delivery be required and an epidural is in place we may be able to use the epidural to provide anesthesia. We also discussed spinal anesthesia as well as general anesthesia in the setting of emergent cesarean delivery.

## 2019-10-02 ENCOUNTER — Encounter: Payer: Self-pay | Admitting: Advanced Practice Midwife

## 2019-10-02 ENCOUNTER — Other Ambulatory Visit: Payer: Self-pay

## 2019-10-02 ENCOUNTER — Ambulatory Visit (INDEPENDENT_AMBULATORY_CARE_PROVIDER_SITE_OTHER): Payer: Medicaid Other | Admitting: Advanced Practice Midwife

## 2019-10-02 VITALS — BP 100/50 | Wt 148.0 lb

## 2019-10-02 DIAGNOSIS — Z3A32 32 weeks gestation of pregnancy: Secondary | ICD-10-CM

## 2019-10-02 DIAGNOSIS — Z3493 Encounter for supervision of normal pregnancy, unspecified, third trimester: Secondary | ICD-10-CM

## 2019-10-02 LAB — POCT URINALYSIS DIPSTICK OB
Glucose, UA: NEGATIVE
POC,PROTEIN,UA: NEGATIVE

## 2019-10-02 NOTE — Progress Notes (Signed)
ROB- no concerns 

## 2019-10-02 NOTE — Progress Notes (Signed)
  Routine Prenatal Care Visit  Subjective  Cynthia Stevenson is a 19 y.o. G1P0 at [redacted]w[redacted]d being seen today for ongoing prenatal care.  She is currently monitored for the following issues for this low-risk pregnancy and has Encounter for supervision of low-risk pregnancy, antepartum on their problem list.  ----------------------------------------------------------------------------------- Patient reports restless leg especially at night.   Contractions: Not present. Vag. Bleeding: None.  Movement: Present. Leaking Fluid denies.  ----------------------------------------------------------------------------------- The following portions of the patient's history were reviewed and updated as appropriate: allergies, current medications, past family history, past medical history, past social history, past surgical history and problem list. Problem list updated.  Objective  Blood pressure (!) 100/50, weight 148 lb (67.1 kg), last menstrual period 02/07/2019. Pregravid weight 115 lb (52.2 kg) Total Weight Gain 33 lb (15 kg) Urinalysis: Urine Protein Negative  Urine Glucose Negative  Fetal Status: Fetal Heart Rate (bpm): 154 Fundal Height: 33 cm Movement: Present     General:  Alert, oriented and cooperative. Patient is in no acute distress.  Skin: Skin is warm and dry. No rash noted.   Cardiovascular: Normal heart rate noted  Respiratory: Normal respiratory effort, no problems with respiration noted  Abdomen: Soft, gravid, appropriate for gestational age. Pain/Pressure: Absent     Pelvic:  Cervical exam deferred        Extremities: Normal range of motion.  Edema: None  Mental Status: Normal mood and affect. Normal behavior. Normal judgment and thought content.   Assessment   19 y.o. G1P0 at [redacted]w[redacted]d by  11/21/2019, by Ultrasound presenting for routine prenatal visit  Plan   pregnancy 1  Problems (from 02/07/19 to present)    Problem Noted Resolved   Encounter for supervision of low-risk pregnancy,  antepartum 05/16/2019 by Gae Dry, MD No   Overview Addendum 09/17/2019  5:06 PM by Dalia Heading, [place] Prenatal Labs  Dating 9 week [place] Blood type: A/Positive/-- (01/08 1520)   Genetic Screen NIPS: normal XY Antibody:Negative (01/08 1520)  Anatomic [place] Complete, normal 3/17 Rubella: 1.64 (01/08 1520) Varicella: Immune  GTT 111 RPR: Non Reactive (01/08 1520)   Rhogam  HBsAg: Negative (01/08 1520)   TDaP vaccine                       Flu Shot: HIV: Non Reactive (01/08 1520)   Baby Food   Breast                             GBS:   Contraception  Pap: N/A  CBB     CS/VBAC    Support Person             Restless leg: stay well hydrated, magnesium supplement, epsom salt soaks   Preterm labor symptoms and general obstetric precautions including but not limited to vaginal bleeding, contractions, leaking of fluid and fetal movement were reviewed in detail with the patient. Please refer to After Visit Summary for other counseling recommendations.   Return in about 2 weeks (around 10/16/2019) for rob.  Rod Can, CNM 10/02/2019 4:33 PM

## 2019-10-02 NOTE — Patient Instructions (Signed)
Third Trimester of Pregnancy The third trimester is from week 28 through week 40 (months 7 through 9). The third trimester is a time when the unborn baby (fetus) is growing rapidly. At the end of the ninth month, the fetus is about 20 inches in length and weighs 6-10 pounds. Body changes during your third trimester Your body will continue to go through many changes during pregnancy. The changes vary from woman to woman. During the third trimester:  Your weight will continue to increase. You can expect to gain 25-35 pounds (11-16 kg) by the end of the pregnancy.  You may begin to get stretch marks on your hips, abdomen, and breasts.  You may urinate more often because the fetus is moving lower into your pelvis and pressing on your bladder.  You may develop or continue to have heartburn. This is caused by increased hormones that slow down muscles in the digestive tract.  You may develop or continue to have constipation because increased hormones slow digestion and cause the muscles that push waste through your intestines to relax.  You may develop hemorrhoids. These are swollen veins (varicose veins) in the rectum that can itch or be painful.  You may develop swollen, bulging veins (varicose veins) in your legs.  You may have increased body aches in the pelvis, back, or thighs. This is due to weight gain and increased hormones that are relaxing your joints.  You may have changes in your hair. These can include thickening of your hair, rapid growth, and changes in texture. Some women also have hair loss during or after pregnancy, or hair that feels dry or thin. Your hair will most likely return to normal after your baby is born.  Your breasts will continue to grow and they will continue to become tender. A yellow fluid (colostrum) may leak from your breasts. This is the first milk you are producing for your baby.  Your belly button may stick out.  You may notice more swelling in your hands,  face, or ankles.  You may have increased tingling or numbness in your hands, arms, and legs. The skin on your belly may also feel numb.  You may feel short of breath because of your expanding uterus.  You may have more problems sleeping. This can be caused by the size of your belly, increased need to urinate, and an increase in your body's metabolism.  You may notice the fetus "dropping," or moving lower in your abdomen (lightening).  You may have increased vaginal discharge.  You may notice your joints feel loose and you may have pain around your pelvic bone. What to expect at prenatal visits You will have prenatal exams every 2 weeks until week 36. Then you will have weekly prenatal exams. During a routine prenatal visit:  You will be weighed to make sure you and the baby are growing normally.  Your blood pressure will be taken.  Your abdomen will be measured to track your baby's growth.  The fetal heartbeat will be listened to.  Any test results from the previous visit will be discussed.  You may have a cervical check near your due date to see if your cervix has softened or thinned (effaced).  You will be tested for Group B streptococcus. This happens between 35 and 37 weeks. Your health care provider may ask you:  What your birth plan is.  How you are feeling.  If you are feeling the baby move.  If you have had any abnormal   symptoms, such as leaking fluid, bleeding, severe headaches, or abdominal cramping.  If you are using any tobacco products, including cigarettes, chewing tobacco, and electronic cigarettes.  If you have any questions. Other tests or screenings that may be performed during your third trimester include:  Blood tests that check for low iron levels (anemia).  Fetal testing to check the health, activity level, and growth of the fetus. Testing is done if you have certain medical conditions or if there are problems during the pregnancy.  Nonstress test  (NST). This test checks the health of your baby to make sure there are no signs of problems, such as the baby not getting enough oxygen. During this test, a belt is placed around your belly. The baby is made to move, and its heart rate is monitored during movement. What is false labor? False labor is a condition in which you feel small, irregular tightenings of the muscles in the womb (contractions) that usually go away with rest, changing position, or drinking water. These are called Braxton Hicks contractions. Contractions may last for hours, days, or even weeks before true labor sets in. If contractions come at regular intervals, become more frequent, increase in intensity, or become painful, you should see your health care provider. What are the signs of labor?  Abdominal cramps.  Regular contractions that start at 10 minutes apart and become stronger and more frequent with time.  Contractions that start on the top of the uterus and spread down to the lower abdomen and back.  Increased pelvic pressure and dull back pain.  A watery or bloody mucus discharge that comes from the vagina.  Leaking of amniotic fluid. This is also known as your "water breaking." It could be a slow trickle or a gush. Let your health care provider know if it has a color or strange odor. If you have any of these signs, call your health care provider right away, even if it is before your due date. Follow these instructions at home: Medicines  Follow your health care provider's instructions regarding medicine use. Specific medicines may be either safe or unsafe to take during pregnancy.  Take a prenatal vitamin that contains at least 600 micrograms (mcg) of folic acid.  If you develop constipation, try taking a stool softener if your health care provider approves. Eating and drinking   Eat a balanced diet that includes fresh fruits and vegetables, whole grains, good sources of protein such as meat, eggs, or tofu,  and low-fat dairy. Your health care provider will help you determine the amount of weight gain that is right for you.  Avoid raw meat and uncooked cheese. These carry germs that can cause birth defects in the baby.  If you have low calcium intake from food, talk to your health care provider about whether you should take a daily calcium supplement.  Eat four or five small meals rather than three large meals a day.  Limit foods that are high in fat and processed sugars, such as fried and sweet foods.  To prevent constipation: ? Drink enough fluid to keep your urine clear or pale yellow. ? Eat foods that are high in fiber, such as fresh fruits and vegetables, whole grains, and beans. Activity  Exercise only as directed by your health care provider. Most women can continue their usual exercise routine during pregnancy. Try to exercise for 30 minutes at least 5 days a week. Stop exercising if you experience uterine contractions.  Avoid heavy lifting.  Do   not exercise in extreme heat or humidity, or at high altitudes.  Wear low-heel, comfortable shoes.  Practice good posture.  You may continue to have sex unless your health care provider tells you otherwise. Relieving pain and discomfort  Take frequent breaks and rest with your legs elevated if you have leg cramps or low back pain.  Take warm sitz baths to soothe any pain or discomfort caused by hemorrhoids. Use hemorrhoid cream if your health care provider approves.  Wear a good support bra to prevent discomfort from breast tenderness.  If you develop varicose veins: ? Wear support pantyhose or compression stockings as told by your healthcare provider. ? Elevate your feet for 15 minutes, 3-4 times a day. Prenatal care  Write down your questions. Take them to your prenatal visits.  Keep all your prenatal visits as told by your health care provider. This is important. Safety  Wear your seat belt at all times when driving.  Make  a list of emergency phone numbers, including numbers for family, friends, the hospital, and police and fire departments. General instructions  Avoid cat litter boxes and soil used by cats. These carry germs that can cause birth defects in the baby. If you have a cat, ask someone to clean the litter box for you.  Do not travel far distances unless it is absolutely necessary and only with the approval of your health care provider.  Do not use hot tubs, steam rooms, or saunas.  Do not drink alcohol.  Do not use any products that contain nicotine or tobacco, such as cigarettes and e-cigarettes. If you need help quitting, ask your health care provider.  Do not use any medicinal herbs or unprescribed drugs. These chemicals affect the formation and growth of the baby.  Do not douche or use tampons or scented sanitary pads.  Do not cross your legs for long periods of time.  To prepare for the arrival of your baby: ? Take prenatal classes to understand, practice, and ask questions about labor and delivery. ? Make a trial run to the hospital. ? Visit the hospital and tour the maternity area. ? Arrange for maternity or paternity leave through employers. ? Arrange for family and friends to take care of pets while you are in the hospital. ? Purchase a rear-facing car seat and make sure you know how to install it in your car. ? Pack your hospital bag. ? Prepare the baby's nursery. Make sure to remove all pillows and stuffed animals from the baby's crib to prevent suffocation.  Visit your dentist if you have not gone during your pregnancy. Use a soft toothbrush to brush your teeth and be gentle when you floss. Contact a health care provider if:  You are unsure if you are in labor or if your water has broken.  You become dizzy.  You have mild pelvic cramps, pelvic pressure, or nagging pain in your abdominal area.  You have lower back pain.  You have persistent nausea, vomiting, or  diarrhea.  You have an unusual or bad smelling vaginal discharge.  You have pain when you urinate. Get help right away if:  Your water breaks before 37 weeks.  You have regular contractions less than 5 minutes apart before 37 weeks.  You have a fever.  You are leaking fluid from your vagina.  You have spotting or bleeding from your vagina.  You have severe abdominal pain or cramping.  You have rapid weight loss or weight gain.  You have   shortness of breath with chest pain.  You notice sudden or extreme swelling of your face, hands, ankles, feet, or legs.  Your baby makes fewer than 10 movements in 2 hours.  You have severe headaches that do not go away when you take medicine.  You have vision changes. Summary  The third trimester is from week 28 through week 40, months 7 through 9. The third trimester is a time when the unborn baby (fetus) is growing rapidly.  During the third trimester, your discomfort may increase as you and your baby continue to gain weight. You may have abdominal, leg, and back pain, sleeping problems, and an increased need to urinate.  During the third trimester your breasts will keep growing and they will continue to become tender. A yellow fluid (colostrum) may leak from your breasts. This is the first milk you are producing for your baby.  False labor is a condition in which you feel small, irregular tightenings of the muscles in the womb (contractions) that eventually go away. These are called Braxton Hicks contractions. Contractions may last for hours, days, or even weeks before true labor sets in.  Signs of labor can include: abdominal cramps; regular contractions that start at 10 minutes apart and become stronger and more frequent with time; watery or bloody mucus discharge that comes from the vagina; increased pelvic pressure and dull back pain; and leaking of amniotic fluid. This information is not intended to replace advice given to you by your  health care provider. Make sure you discuss any questions you have with your health care provider. Document Revised: 08/15/2018 Document Reviewed: 05/30/2016 Elsevier Patient Education  2020 [employer]  

## 2019-10-16 ENCOUNTER — Ambulatory Visit (INDEPENDENT_AMBULATORY_CARE_PROVIDER_SITE_OTHER): Payer: Medicaid Other | Admitting: Obstetrics & Gynecology

## 2019-10-16 ENCOUNTER — Other Ambulatory Visit: Payer: Self-pay

## 2019-10-16 ENCOUNTER — Encounter: Payer: Self-pay | Admitting: Obstetrics & Gynecology

## 2019-10-16 VITALS — BP 100/60 | Wt 151.0 lb

## 2019-10-16 DIAGNOSIS — Z3A34 34 weeks gestation of pregnancy: Secondary | ICD-10-CM

## 2019-10-16 DIAGNOSIS — Z3490 Encounter for supervision of normal pregnancy, unspecified, unspecified trimester: Secondary | ICD-10-CM

## 2019-10-16 DIAGNOSIS — Z3493 Encounter for supervision of normal pregnancy, unspecified, third trimester: Secondary | ICD-10-CM

## 2019-10-16 NOTE — Progress Notes (Signed)
  Subjective  Fetal Movement? yes Contractions? no Leaking Fluid? no Vaginal Bleeding? no  Objective  BP 100/60   Wt 151 lb (68.5 kg)   LMP 02/07/2019 (Approximate)   BMI 27.62 kg/m  General: NAD Pumonary: no increased work of breathing Abdomen: gravid, non-tender Extremities: no edema Psychiatric: mood appropriate, affect full  Assessment  19 y.o. G1P0 at [redacted]w[redacted]d by  11/21/2019, by Ultrasound presenting for routine prenatal visit  Plan   Problem List Items Addressed This Visit      Other   Encounter for supervision of low-risk pregnancy, antepartum    Other Visit Diagnoses    [redacted] weeks gestation of pregnancy    -  Primary   Encounter for supervision of low-risk pregnancy in third trimester          pregnancy 1  Problems (from 02/07/19 to present)    Problem Noted Resolved   Encounter for supervision of low-risk pregnancy, antepartum 05/16/2019 by Gae Dry, MD No   Overview Addendum 10/16/2019  2:26 PM by Gae Dry, MD    [facility] Prenatal Labs  Dating 9 week [place] Blood type: A/Positive/-- (01/08 1520)   Genetic Screen NIPS: normal XY Antibody:Negative (01/08 1520)  Anatomic [place] Complete, normal 3/17 Rubella: 1.64 (01/08 1520) Varicella: Immune  GTT 111 RPR: Non Reactive (01/08 1520)   Rhogam  HBsAg: Negative (01/08 1520)   TDaP vaccine                       Flu Shot: HIV: Non Reactive (01/08 1520)   Baby Food   Breast                             GBS:   Contraception   Paraguard IUD Pap: N/A, do PP  CBB  no   CS/VBAC n/a   Support Person            Previous Version     PNV GBS nv [place] IUD for contraception planned  Barnett Applebaum, MD, [facility], [facility] 10/16/2019  2:26 PM

## 2019-10-16 NOTE — Patient Instructions (Signed)
Braxton Hicks Contractions °Contractions of the uterus can occur throughout pregnancy, but they are not always a sign that you are in labor. You may have practice contractions called Braxton Hicks contractions. These false labor contractions are sometimes confused with true labor. °What are Braxton Hicks contractions? °Braxton Hicks contractions are tightening movements that occur in the muscles of the uterus before labor. Unlike true labor contractions, these contractions do not result in opening (dilation) and thinning of the cervix. Toward the end of pregnancy (32-34 weeks), Braxton Hicks contractions can happen more often and may become stronger. These contractions are sometimes difficult to tell apart from true labor because they can be very uncomfortable. You should not feel embarrassed if you go to the hospital with false labor. °Sometimes, the only way to tell if you are in true labor is for your health care provider to look for changes in the cervix. The health care provider will do a physical exam and may monitor your contractions. If you are not in true labor, the exam should show that your cervix is not dilating and your water has not broken. °If there are no other health problems associated with your pregnancy, it is completely safe for you to be sent home with false labor. You may continue to have Braxton Hicks contractions until you go into true labor. °How to tell the difference between true labor and false labor °True labor °· Contractions last 30-70 seconds. °· Contractions become very regular. °· Discomfort is usually felt in the top of the uterus, and it spreads to the lower abdomen and low back. °· Contractions do not go away with walking. °· Contractions usually become more intense and increase in frequency. °· The cervix dilates and gets thinner. °False labor °· Contractions are usually shorter and not as strong as true labor contractions. °· Contractions are usually irregular. °· Contractions  are often felt in the front of the lower abdomen and in the groin. °· Contractions may go away when you walk around or change positions while lying down. °· Contractions get weaker and are shorter-lasting as time goes on. °· The cervix usually does not dilate or become thin. °Follow these instructions at home: ° °· Take over-the-counter and prescription medicines only as told by your health care provider. °· Keep up with your usual exercises and follow other instructions from your health care provider. °· Eat and drink lightly if you think you are going into labor. °· If Braxton Hicks contractions are making you uncomfortable: °? Change your position from lying down or resting to walking, or change from walking to resting. °? Sit and rest in a tub of warm water. °? Drink enough fluid to keep your urine pale yellow. Dehydration may cause these contractions. °? Do slow and deep breathing several times an hour. °· Keep all follow-up prenatal visits as told by your health care provider. This is important. °Contact a health care provider if: °· You have a fever. °· You have continuous pain in your abdomen. °Get help right away if: °· Your contractions become stronger, more regular, and closer together. °· You have fluid leaking or gushing from your vagina. °· You pass blood-tinged mucus (bloody show). °· You have bleeding from your vagina. °· You have low back pain that you never had before. °· You feel your baby’s head pushing down and causing pelvic pressure. °· Your baby is not moving inside you as much as it used to. °Summary °· Contractions that occur before labor are   called Braxton Hicks contractions, false labor, or practice contractions. °· Braxton Hicks contractions are usually shorter, weaker, farther apart, and less regular than true labor contractions. True labor contractions usually become progressively stronger and regular, and they become more frequent. °· Manage discomfort from Braxton Hicks contractions  by changing position, resting in a warm bath, drinking plenty of water, or practicing deep breathing. °This information is not intended to replace advice given to you by your health care provider. Make sure you discuss any questions you have with your health care provider. °Document Revised: 04/06/2017 Document Reviewed: 09/07/2016 °[employer] Patient Education © 2020 [employer] ° °

## 2019-10-27 ENCOUNTER — Other Ambulatory Visit (HOSPITAL_COMMUNITY)
Admission: RE | Admit: 2019-10-27 | Discharge: 2019-10-27 | Disposition: A | Discharge status: Home / Self Care | Payer: Medicaid Other | Source: Ambulatory Visit | Attending: Obstetrics and Gynecology | Admitting: Obstetrics and Gynecology

## 2019-10-27 ENCOUNTER — Encounter: Payer: Self-pay | Admitting: Obstetrics and Gynecology

## 2019-10-27 ENCOUNTER — Ambulatory Visit (INDEPENDENT_AMBULATORY_CARE_PROVIDER_SITE_OTHER): Payer: Medicaid Other | Admitting: Obstetrics and Gynecology

## 2019-10-27 ENCOUNTER — Other Ambulatory Visit: Payer: Self-pay

## 2019-10-27 VITALS — BP 110/70 | Ht 62.0 in | Wt 149.6 lb

## 2019-10-27 DIAGNOSIS — Z3493 Encounter for supervision of normal pregnancy, unspecified, third trimester: Secondary | ICD-10-CM

## 2019-10-27 DIAGNOSIS — Z3A36 36 weeks gestation of pregnancy: Secondary | ICD-10-CM

## 2019-10-27 LAB — POCT URINALYSIS DIPSTICK OB: Glucose, UA: NEGATIVE

## 2019-10-27 NOTE — Patient Instructions (Signed)
Third Trimester of Pregnancy The third trimester is from week 28 through week 40 (months 7 through 9). The third trimester is a time when the unborn baby (fetus) is growing rapidly. At the end of the ninth month, the fetus is about 20 inches in length and weighs 6-10 pounds. Body changes during your third trimester Your body will continue to go through many changes during pregnancy. The changes vary from woman to woman. During the third trimester:  Your weight will continue to increase. You can expect to gain 25-35 pounds (11-16 kg) by the end of the pregnancy.  You may begin to get stretch marks on your hips, abdomen, and breasts.  You may urinate more often because the fetus is moving lower into your pelvis and pressing on your bladder.  You may develop or continue to have heartburn. This is caused by increased hormones that slow down muscles in the digestive tract.  You may develop or continue to have constipation because increased hormones slow digestion and cause the muscles that push waste through your intestines to relax.  You may develop hemorrhoids. These are swollen veins (varicose veins) in the rectum that can itch or be painful.  You may develop swollen, bulging veins (varicose veins) in your legs.  You may have increased body aches in the pelvis, back, or thighs. This is due to weight gain and increased hormones that are relaxing your joints.  You may have changes in your hair. These can include thickening of your hair, rapid growth, and changes in texture. Some women also have hair loss during or after pregnancy, or hair that feels dry or thin. Your hair will most likely return to normal after your baby is born.  Your breasts will continue to grow and they will continue to become tender. A yellow fluid (colostrum) may leak from your breasts. This is the first milk you are producing for your baby.  Your belly button may stick out.  You may notice more swelling in your hands,  face, or ankles.  You may have increased tingling or numbness in your hands, arms, and legs. The skin on your belly may also feel numb.  You may feel short of breath because of your expanding uterus.  You may have more problems sleeping. This can be caused by the size of your belly, increased need to urinate, and an increase in your body's metabolism.  You may notice the fetus "dropping," or moving lower in your abdomen (lightening).  You may have increased vaginal discharge.  You may notice your joints feel loose and you may have pain around your pelvic bone. What to expect at prenatal visits You will have prenatal exams every 2 weeks until week 36. Then you will have weekly prenatal exams. During a routine prenatal visit:  You will be weighed to make sure you and the baby are growing normally.  Your blood pressure will be taken.  Your abdomen will be measured to track your baby's growth.  The fetal heartbeat will be listened to.  Any test results from the previous visit will be discussed.  You may have a cervical check near your due date to see if your cervix has softened or thinned (effaced).  You will be tested for Group B streptococcus. This happens between 35 and 37 weeks. Your health care provider may ask you:  What your birth plan is.  How you are feeling.  If you are feeling the baby move.  If you have had any abnormal   symptoms, such as leaking fluid, bleeding, severe headaches, or abdominal cramping.  If you are using any tobacco products, including cigarettes, chewing tobacco, and electronic cigarettes.  If you have any questions. Other tests or screenings that may be performed during your third trimester include:  Blood tests that check for low iron levels (anemia).  Fetal testing to check the health, activity level, and growth of the fetus. Testing is done if you have certain medical conditions or if there are problems during the pregnancy.  Nonstress test  (NST). This test checks the health of your baby to make sure there are no signs of problems, such as the baby not getting enough oxygen. During this test, a belt is placed around your belly. The baby is made to move, and its heart rate is monitored during movement. What is false labor? False labor is a condition in which you feel small, irregular tightenings of the muscles in the womb (contractions) that usually go away with rest, changing position, or drinking water. These are called Braxton Hicks contractions. Contractions may last for hours, days, or even weeks before true labor sets in. If contractions come at regular intervals, become more frequent, increase in intensity, or become painful, you should see your health care provider. What are the signs of labor?  Abdominal cramps.  Regular contractions that start at 10 minutes apart and become stronger and more frequent with time.  Contractions that start on the top of the uterus and spread down to the lower abdomen and back.  Increased pelvic pressure and dull back pain.  A watery or bloody mucus discharge that comes from the vagina.  Leaking of amniotic fluid. This is also known as your "water breaking." It could be a slow trickle or a gush. Let your health care provider know if it has a color or strange odor. If you have any of these signs, call your health care provider right away, even if it is before your due date. Follow these instructions at home: Medicines  Follow your health care provider's instructions regarding medicine use. Specific medicines may be either safe or unsafe to take during pregnancy.  Take a prenatal vitamin that contains at least 600 micrograms (mcg) of folic acid.  If you develop constipation, try taking a stool softener if your health care provider approves. Eating and drinking   Eat a balanced diet that includes fresh fruits and vegetables, whole grains, good sources of protein such as meat, eggs, or tofu,  and low-fat dairy. Your health care provider will help you determine the amount of weight gain that is right for you.  Avoid raw meat and uncooked cheese. These carry germs that can cause birth defects in the baby.  If you have low calcium intake from food, talk to your health care provider about whether you should take a daily calcium supplement.  Eat four or five small meals rather than three large meals a day.  Limit foods that are high in fat and processed sugars, such as fried and sweet foods.  To prevent constipation: ? Drink enough fluid to keep your urine clear or pale yellow. ? Eat foods that are high in fiber, such as fresh fruits and vegetables, whole grains, and beans. Activity  Exercise only as directed by your health care provider. Most women can continue their usual exercise routine during pregnancy. Try to exercise for 30 minutes at least 5 days a week. Stop exercising if you experience uterine contractions.  Avoid heavy lifting.  Do   not exercise in extreme heat or humidity, or at high altitudes.  Wear low-heel, comfortable shoes.  Practice good posture.  You may continue to have sex unless your health care provider tells you otherwise. Relieving pain and discomfort  Take frequent breaks and rest with your legs elevated if you have leg cramps or low back pain.  Take warm sitz baths to soothe any pain or discomfort caused by hemorrhoids. Use hemorrhoid cream if your health care provider approves.  Wear a good support bra to prevent discomfort from breast tenderness.  If you develop varicose veins: ? Wear support pantyhose or compression stockings as told by your healthcare provider. ? Elevate your feet for 15 minutes, 3-4 times a day. Prenatal care  Write down your questions. Take them to your prenatal visits.  Keep all your prenatal visits as told by your health care provider. This is important. Safety  Wear your seat belt at all times when driving.  Make  a list of emergency phone numbers, including numbers for family, friends, the hospital, and police and fire departments. General instructions  Avoid cat litter boxes and soil used by cats. These carry germs that can cause birth defects in the baby. If you have a cat, ask someone to clean the litter box for you.  Do not travel far distances unless it is absolutely necessary and only with the approval of your health care provider.  Do not use hot tubs, steam rooms, or saunas.  Do not drink alcohol.  Do not use any products that contain nicotine or tobacco, such as cigarettes and e-cigarettes. If you need help quitting, ask your health care provider.  Do not use any medicinal herbs or unprescribed drugs. These chemicals affect the formation and growth of the baby.  Do not douche or use tampons or scented sanitary pads.  Do not cross your legs for long periods of time.  To prepare for the arrival of your baby: ? Take prenatal classes to understand, practice, and ask questions about labor and delivery. ? Make a trial run to the hospital. ? Visit the hospital and tour the maternity area. ? Arrange for maternity or paternity leave through employers. ? Arrange for family and friends to take care of pets while you are in the hospital. ? Purchase a rear-facing car seat and make sure you know how to install it in your car. ? Pack your hospital bag. ? Prepare the baby's nursery. Make sure to remove all pillows and stuffed animals from the baby's crib to prevent suffocation.  Visit your dentist if you have not gone during your pregnancy. Use a soft toothbrush to brush your teeth and be gentle when you floss. Contact a health care provider if:  You are unsure if you are in labor or if your water has broken.  You become dizzy.  You have mild pelvic cramps, pelvic pressure, or nagging pain in your abdominal area.  You have lower back pain.  You have persistent nausea, vomiting, or  diarrhea.  You have an unusual or bad smelling vaginal discharge.  You have pain when you urinate. Get help right away if:  Your water breaks before 37 weeks.  You have regular contractions less than 5 minutes apart before 37 weeks.  You have a fever.  You are leaking fluid from your vagina.  You have spotting or bleeding from your vagina.  You have severe abdominal pain or cramping.  You have rapid weight loss or weight gain.  You have   shortness of breath with chest pain.  You notice sudden or extreme swelling of your face, hands, ankles, feet, or legs.  Your baby makes fewer than 10 movements in 2 hours.  You have severe headaches that do not go away when you take medicine.  You have vision changes. Summary  The third trimester is from week 28 through week 40, months 7 through 9. The third trimester is a time when the unborn baby (fetus) is growing rapidly.  During the third trimester, your discomfort may increase as you and your baby continue to gain weight. You may have abdominal, leg, and back pain, sleeping problems, and an increased need to urinate.  During the third trimester your breasts will keep growing and they will continue to become tender. A yellow fluid (colostrum) may leak from your breasts. This is the first milk you are producing for your baby.  False labor is a condition in which you feel small, irregular tightenings of the muscles in the womb (contractions) that eventually go away. These are called Braxton Hicks contractions. Contractions may last for hours, days, or even weeks before true labor sets in.  Signs of labor can include: abdominal cramps; regular contractions that start at 10 minutes apart and become stronger and more frequent with time; watery or bloody mucus discharge that comes from the vagina; increased pelvic pressure and dull back pain; and leaking of amniotic fluid. This information is not intended to replace advice given to you by your  health care provider. Make sure you discuss any questions you have with your health care provider. Document Revised: 08/15/2018 Document Reviewed: 05/30/2016 Elsevier Patient Education  2020 [employer]   Pain Relief During Labor and Delivery Many things can cause pain during labor and delivery, including:  Pressure on bones and ligaments due to the baby moving through the pelvis.  Stretching of tissues due to the baby moving through the birth canal.  Muscle tension due to anxiety or nervousness.  The uterus tightening (contracting) and relaxing to help move the baby. There are many ways to deal with the pain of labor and delivery. They include:  Taking prenatal classes. Taking these classes helps you know what to expect during your baby's birth. What you learn will increase your confidence and decrease your anxiety.  Practicing relaxation techniques or doing relaxing activities, such as: ? Focused breathing. ? Meditation. ? Visualization. ? Aroma therapy. ? Listening to your favorite music. ? Hypnosis.  Taking a warm shower or bath (hydrotherapy). This may: ? Provide comfort and relaxation. ? Lessen your perception of pain. ? Decrease the amount of pain medicine needed. ? Decrease the length of labor.  Getting a massage or counterpressure on your back.  Applying warm packs or ice packs.  Changing positions often, moving around, or using a birthing ball.  Getting: ? Pain medicine through an IV or injection into a muscle. ? Pain medicine inserted into your spinal column. ? Injections of sterile water just under the skin on your lower back (intradermal injections). ? Laughing gas (nitrous oxide). Discuss your pain control options with your health care provider during your prenatal visits. Explore the options offered by your hospital or birth center. What kinds of medicine are available? There are two kinds of medicines that can be used to relieve pain during labor and  delivery:  Analgesics. These medicines decrease pain without causing you to lose feeling or the ability to move your muscles.  Anesthetics. These medicines block feeling in the body   and can decrease your ability to move freely. Both of these kinds of medicine can cause minor side effects, such as nausea, trouble concentrating, and sleepiness. They can also decrease the baby's heart rate before birth and affect the baby's breathing rate after birth. For this reason, health care providers are careful about when and how much medicine is given. What are specific medicines and procedures that provide pain relief? Local Anesthetics Local anesthetics are used to numb a small area of the body. They may be used along with another kind of anesthetic or used to numb the nerves of the vagina, cervix, and perineum during the second stage of labor. General Anesthetics General anesthetics cause you to lose consciousness so you do not feel pain. They are usually only used for an emergency cesarean delivery. General anesthetics are given through an IV tube and a mask. Pudendal Block A pudendal block is a form of local anesthetic. It may be used to relieve the pain associated with pushing or stretching of the perineum at the time of delivery or to further numb the perineum. A pudendal block is done by injecting numbing medicine through the vaginal wall into a nerve in the pelvis. Epidural Analgesia Epidural analgesia is given through a flexible IV catheter that is inserted into the lower back. Numbing medicine is delivered continuously to the area near your spinal column nerves (epidural space). After having this type of analgesia, you may be able to move your legs but you most likely will not be able to walk. Depending on the amount of medicine given, you may lose all feeling in the lower half of your body, or you may retain some level of sensation, including the urge to push. Epidural analgesia can be used to provide  pain relief for a vaginal birth. Spinal Block A spinal block is similar to epidural analgesia, but the medicine is injected into the spinal fluid instead of the epidural space. A spinal block is only given once. It starts to relieve pain quickly, but the pain relief lasts only 1-6 hours. Spinal blocks can be used for cesarean deliveries. Combined Spinal-Epidural (CSE) Block A CSE block combines the effects of a spinal block and epidural analgesia. The spinal block works quickly to block all pain. The epidural analgesia provides continuous pain relief, even after the effects of the spinal block have worn off. This information is not intended to replace advice given to you by your health care provider. Make sure you discuss any questions you have with your health care provider. Document Revised: 04/06/2017 Document Reviewed: 09/15/2015 Elsevier Patient Education  2020 [employer].   Vaginal Delivery  Vaginal delivery means that you give birth by pushing your baby out of your birth canal (vagina). A team of health care providers will help you before, during, and after vaginal delivery. Birth experiences are unique for every woman and every pregnancy, and birth experiences vary depending on where you choose to give birth. What happens when I arrive at the birth center or hospital? Once you are in labor and have been admitted into the hospital or birth center, your health care provider may:  Review your pregnancy history and any concerns that you have.  Insert an IV into one of your veins. This may be used to give you fluids and medicines.  Check your blood pressure, pulse, temperature, and heart rate (vital signs).  Check whether your bag of water (amniotic sac) has broken (ruptured).  Talk with you about your birth plan and  discuss pain control options. Monitoring Your health care provider may monitor your contractions (uterine monitoring) and your baby's heart rate (fetal monitoring). You  may need to be monitored:  Often, but not continuously (intermittently).  All the time or for long periods at a time (continuously). Continuous monitoring may be needed if: ? You are taking certain medicines, such as medicine to relieve pain or make your contractions stronger. ? You have pregnancy or labor complications. Monitoring may be done by:  Placing a special stethoscope or a handheld monitoring device on your abdomen to check your baby's heartbeat and to check for contractions.  Placing monitors on your abdomen (external monitors) to record your baby's heartbeat and the frequency and length of contractions.  Placing monitors inside your uterus through your vagina (internal monitors) to record your baby's heartbeat and the frequency, length, and strength of your contractions. Depending on the type of monitor, it may remain in your uterus or on your baby's head until birth.  Telemetry. This is a type of continuous monitoring that can be done with external or internal monitors. Instead of having to stay in bed, you are able to move around during telemetry. Physical exam Your health care provider may perform frequent physical exams. This may include:  Checking how and where your baby is positioned in your uterus.  Checking your cervix to determine: ? Whether it is thinning out (effacing). ? Whether it is opening up (dilating). What happens during labor and delivery?  Normal labor and delivery is divided into the following three stages: Stage 1  This is the longest stage of labor.  This stage can last for hours or days.  Throughout this stage, you will feel contractions. Contractions generally feel mild, infrequent, and irregular at first. They get stronger, more frequent (about every 2-3 minutes), and more regular as you move through this stage.  This stage ends when your cervix is completely dilated to 4 inches (10 cm) and completely effaced. Stage 2  This stage starts once  your cervix is completely effaced and dilated and lasts until the delivery of your baby.  This stage may last from 20 minutes to 2 hours.  This is the stage where you will feel an urge to push your baby out of your vagina.  You may feel stretching and burning pain, especially when the widest part of your baby's head passes through the vaginal opening (crowning).  Once your baby is delivered, the umbilical cord will be clamped and cut. This usually occurs after waiting a period of 1-2 minutes after delivery.  Your baby will be placed on your bare chest (skin-to-skin contact) in an upright position and covered with a warm blanket. Watch your baby for feeding cues, like rooting or sucking, and help the baby to your breast for his or her first feeding. Stage 3  This stage starts immediately after the birth of your baby and ends after you deliver the placenta.  This stage may take anywhere from 5 to 30 minutes.  After your baby has been delivered, you will feel contractions as your body expels the placenta and your uterus contracts to control bleeding. What can I expect after labor and delivery?  After labor is over, you and your baby will be monitored closely until you are ready to go home to ensure that you are both healthy. Your health care team will teach you how to care for yourself and your baby.  You and your baby will stay in the same  room (rooming in) during your hospital stay. This will encourage early bonding and successful breastfeeding.  You may continue to receive fluids and medicines through an IV.  Your uterus will be checked and massaged regularly (fundal massage).  You will have some soreness and pain in your abdomen, vagina, and the area of skin between your vaginal opening and your anus (perineum).  If an incision was made near your vagina (episiotomy) or if you had some vaginal tearing during delivery, cold compresses may be placed on your episiotomy or your tear. This  helps to reduce pain and swelling.  You may be given a squirt bottle to use instead of wiping when you go to the bathroom. To use the squirt bottle, follow these steps: ? Before you urinate, fill the squirt bottle with warm water. Do not use hot water. ? After you urinate, while you are sitting on the toilet, use the squirt bottle to rinse the area around your urethra and vaginal opening. This rinses away any urine and blood. ? Fill the squirt bottle with clean water every time you use the bathroom.  It is normal to have vaginal bleeding after delivery. Wear a sanitary pad for vaginal bleeding and discharge. Summary  Vaginal delivery means that you will give birth by pushing your baby out of your birth canal (vagina).  Your health care provider may monitor your contractions (uterine monitoring) and your baby's heart rate (fetal monitoring).  Your health care provider may perform a physical exam.  Normal labor and delivery is divided into three stages.  After labor is over, you and your baby will be monitored closely until you are ready to go home. This information is not intended to replace advice given to you by your health care provider. Make sure you discuss any questions you have with your health care provider. Document Revised: 05/29/2017 Document Reviewed: 05/29/2017 Elsevier Patient Education  [place].   Pain Relief During Labor and Delivery Many things can cause pain during labor and delivery, including:  Pressure on bones and ligaments due to the baby moving through the pelvis.  Stretching of tissues due to the baby moving through the birth canal.  Muscle tension due to anxiety or nervousness.  The uterus tightening (contracting) and relaxing to help move the baby. There are many ways to deal with the pain of labor and delivery. They include:  Taking prenatal classes. Taking these classes helps you know what to expect during your baby's birth. What you learn  will increase your confidence and decrease your anxiety.  Practicing relaxation techniques or doing relaxing activities, such as: ? Focused breathing. ? Meditation. ? Visualization. ? Aroma therapy. ? Listening to your favorite music. ? Hypnosis.  Taking a warm shower or bath (hydrotherapy). This may: ? Provide comfort and relaxation. ? Lessen your perception of pain. ? Decrease the amount of pain medicine needed. ? Decrease the length of labor.  Getting a massage or counterpressure on your back.  Applying warm packs or ice packs.  Changing positions often, moving around, or using a birthing ball.  Getting: ? Pain medicine through an IV or injection into a muscle. ? Pain medicine inserted into your spinal column. ? Injections of sterile water just under the skin on your lower back (intradermal injections). ? Laughing gas (nitrous oxide). Discuss your pain control options with your health care provider during your prenatal visits. Explore the options offered by your hospital or birth center. What kinds of medicine are available? There  are two kinds of medicines that can be used to relieve pain during labor and delivery:  Analgesics. These medicines decrease pain without causing you to lose feeling or the ability to move your muscles.  Anesthetics. These medicines block feeling in the body and can decrease your ability to move freely. Both of these kinds of medicine can cause minor side effects, such as nausea, trouble concentrating, and sleepiness. They can also decrease the baby's heart rate before birth and affect the baby's breathing rate after birth. For this reason, health care providers are careful about when and how much medicine is given. What are specific medicines and procedures that provide pain relief? Local Anesthetics Local anesthetics are used to numb a small area of the body. They may be used along with another kind of anesthetic or used to numb the nerves of the  vagina, cervix, and perineum during the second stage of labor. General Anesthetics General anesthetics cause you to lose consciousness so you do not feel pain. They are usually only used for an emergency cesarean delivery. General anesthetics are given through an IV tube and a mask. Pudendal Block A pudendal block is a form of local anesthetic. It may be used to relieve the pain associated with pushing or stretching of the perineum at the time of delivery or to further numb the perineum. A pudendal block is done by injecting numbing medicine through the vaginal wall into a nerve in the pelvis. Epidural Analgesia Epidural analgesia is given through a flexible IV catheter that is inserted into the lower back. Numbing medicine is delivered continuously to the area near your spinal column nerves (epidural space). After having this type of analgesia, you may be able to move your legs but you most likely will not be able to walk. Depending on the amount of medicine given, you may lose all feeling in the lower half of your body, or you may retain some level of sensation, including the urge to push. Epidural analgesia can be used to provide pain relief for a vaginal birth. Spinal Block A spinal block is similar to epidural analgesia, but the medicine is injected into the spinal fluid instead of the epidural space. A spinal block is only given once. It starts to relieve pain quickly, but the pain relief lasts only 1-6 hours. Spinal blocks can be used for cesarean deliveries. Combined Spinal-Epidural (CSE) Block A CSE block combines the effects of a spinal block and epidural analgesia. The spinal block works quickly to block all pain. The epidural analgesia provides continuous pain relief, even after the effects of the spinal block have worn off. This information is not intended to replace advice given to you by your health care provider. Make sure you discuss any questions you have with your health care  provider. Document Revised: 04/06/2017 Document Reviewed: 09/15/2015 Elsevier Patient Education  [employer].   Augmentation of Labor  Augmentation of labor is when steps are taken to stimulate and strengthen contractions of the uterus during labor. This may be done when contractions have slowed down or stopped, delaying progress of labor and delivery of the baby. Before beginning augmentation of labor, your health care provider will evaluate your condition, your baby's condition, the size and position of your baby, and the size of your birth canal. What are some reasons for labor augmentation? Augmentation of labor may be needed when:  You are in labor but your contractions are weak or irregular.  You are in labor but your contractions  have stopped. What methods are used for labor augmentation? Labor augmentation may be done by:  Giving medicine that stimulates contractions (oxytocin). This is given through an IV tube that is inserted into one of your veins.  Breaking the fluid-filled sac that surrounds the fetus (amniotic sac). What are the risks associated with labor augmentation? Some risks of labor augmentation include:  Too much stimulation of the contractions, resulting in continuous, prolonged, or very strong contractions.  Increased risk of infection for you and your baby.  Tearing (rupture) of the uterus.  Breaking off (abruption) of the placenta.  Increased risk of cesarean, forceps, or vacuum delivery.  Excessive bleeding after delivery (postpartum hemorrhage).  Death of the baby (fetal death). What are some reasons for not doing labor augmentation? Augmentation of labor should not be done if:  The baby is too big for the birth canal. This can be confirmed with an ultrasound.  The umbilical cord drops in front of the baby's head or breech part (prolapsed cord).  You have had a cesarean delivery and you had a vertical incision or you do not know what type  of incision you had.  You have had surgery on or into your uterus.  You have an active herpes outbreak.  You have cervical cancer.  The placenta blocks the opening of the cervix (placenta previa) or you have other condition that is blocking the cervix or vaginal outlet.  The baby is lying sideways.  Your pelvis is will not permit the passage of the baby.  You are carrying more than two babies. Summary  Augmentation of labor is when steps are taken to stimulate and strengthen contractions of the uterus during labor. This may be done when contractions have slowed down or stopped, delaying progress of labor and delivery of the baby.  Labor augmentation may be done using medicine to stimulate contractions (oxytocin) or by breaking the fluid-filled sac that surrounds the fetus (amniotic sac).  Labor should not be augmented if you have had a cesarean delivery and you had a vertical incision or you do not know what type of incision you had. This information is not intended to replace advice given to you by your health care provider. Make sure you discuss any questions you have with your health care provider. Document Revised: 02/18/2019 Document Reviewed: 05/29/2016 Elsevier Patient Education  2020 [employer].

## 2019-10-27 NOTE — Progress Notes (Signed)
    Routine Prenatal Care Visit  Subjective  Cynthia Stevenson is a 19 y.o. G1P0 at [redacted]w[redacted]d being seen today for ongoing prenatal care.  She is currently monitored for the following issues for this low-risk pregnancy and has Encounter for supervision of low-risk pregnancy, antepartum on their problem list.  ----------------------------------------------------------------------------------- Patient reports no complaints and starting to feel more pelvic pressure.   Contractions: Irritability. Vag. Bleeding: None.  Movement: Present. Denies leaking of fluid.  ----------------------------------------------------------------------------------- The following portions of the patient's history were reviewed and updated as appropriate: allergies, current medications, past family history, past medical history, past social history, past surgical history and problem list. Problem list updated.   Objective  Blood pressure 110/70, height 5\' 2"  (1.575 m), weight 149 lb 9.6 oz (67.9 kg), last menstrual period 02/07/2019. Pregravid weight 115 lb (52.2 kg) Total Weight Gain 34 lb 9.6 oz (15.7 kg) Urinalysis:      Fetal Status: Fetal Heart Rate (bpm): 156 Fundal Height: 35 cm Movement: Present  Presentation: Vertex  General:  Alert, oriented and cooperative. Patient is in no acute distress.  Skin: Skin is warm and dry. No rash noted.   Cardiovascular: Normal heart rate noted  Respiratory: Normal respiratory effort, no problems with respiration noted  Abdomen: Soft, gravid, appropriate for gestational age. Pain/Pressure: Present     Pelvic:  Cervical exam performed Dilation: 1 Effacement (%): 60 Station: -3  Extremities: Normal range of motion.  Edema: None  Mental Status: Normal mood and affect. Normal behavior. Normal judgment and thought content.     Assessment   19 y.o. G1P0 at [redacted]w[redacted]d by  11/21/2019, by Ultrasound presenting for routine prenatal visit  Plan   pregnancy 1  Problems (from 02/07/19 to  present)    Problem Noted Resolved   Encounter for supervision of low-risk pregnancy, antepartum 05/16/2019 by Gae Dry, MD No   Overview Addendum 10/16/2019  2:26 PM by Gae Dry, MD    Clinic [facility] Prenatal Labs  Dating 9 week [place] Blood type: A/Positive/-- (01/08 1520)   Genetic Screen NIPS: normal XY Antibody:Negative (01/08 1520)  Anatomic [place] Complete, normal 3/17 Rubella: 1.64 (01/08 1520) Varicella: Immune  GTT 111 RPR: Non Reactive (01/08 1520)   Rhogam  HBsAg: Negative (01/08 1520)   TDaP vaccine                       Flu Shot: HIV: Non Reactive (01/08 1520)   Baby Food   Breast                             GBS:   Contraception   Paraguard IUD Pap: N/A, do PP  CBB  no   CS/VBAC n/a   Support Person            Previous Version      Discussed labor precautions. Given information about vaginal delivery and pain management in labor.  Gestational age appropriate obstetric precautions including but not limited to vaginal bleeding, contractions, leaking of fluid and fetal movement were reviewed in detail with the patient.    Return in about 1 week (around 11/03/2019) for ROB in person.  Homero Fellers MD [facility], [facility] 10/27/2019, 12:14 PM

## 2019-10-28 LAB — CERVICOVAGINAL ANCILLARY ONLY
Chlamydia: NEGATIVE
Comment: NEGATIVE
Comment: NORMAL
Neisseria Gonorrhea: NEGATIVE

## 2019-10-31 LAB — CULTURE, BETA STREP (GROUP B ONLY): Strep Gp B Culture: NEGATIVE

## 2019-11-04 ENCOUNTER — Observation Stay: Payer: Medicaid Other

## 2019-11-04 ENCOUNTER — Telehealth: Payer: Self-pay

## 2019-11-04 ENCOUNTER — Encounter: Payer: Self-pay | Admitting: Obstetrics & Gynecology

## 2019-11-04 ENCOUNTER — Other Ambulatory Visit: Payer: Self-pay

## 2019-11-04 ENCOUNTER — Inpatient Hospital Stay
Admission: EM | Admit: 2019-11-04 | Discharge: 2019-11-07 | DRG: 786 | Disposition: A | Discharge status: Home / Self Care | Payer: Medicaid Other | Attending: Obstetrics & Gynecology | Admitting: Obstetrics & Gynecology

## 2019-11-04 ENCOUNTER — Inpatient Hospital Stay: Payer: Medicaid Other | Admitting: Anesthesiology

## 2019-11-04 ENCOUNTER — Encounter
Admission: EM | Disposition: A | Discharge status: Home / Self Care | Payer: Self-pay | Source: Home / Self Care | Attending: Obstetrics & Gynecology

## 2019-11-04 DIAGNOSIS — Z3A37 37 weeks gestation of pregnancy: Secondary | ICD-10-CM

## 2019-11-04 DIAGNOSIS — O4593 Premature separation of placenta, unspecified, third trimester: Secondary | ICD-10-CM | POA: Diagnosis present

## 2019-11-04 DIAGNOSIS — O368130 Decreased fetal movements, third trimester, not applicable or unspecified: Secondary | ICD-10-CM | POA: Diagnosis present

## 2019-11-04 DIAGNOSIS — D62 Acute posthemorrhagic anemia: Secondary | ICD-10-CM | POA: Diagnosis not present

## 2019-11-04 DIAGNOSIS — O4590 Premature separation of placenta, unspecified, unspecified trimester: Secondary | ICD-10-CM | POA: Diagnosis present

## 2019-11-04 DIAGNOSIS — Z8616 Personal history of COVID-19: Secondary | ICD-10-CM | POA: Diagnosis not present

## 2019-11-04 DIAGNOSIS — O9081 Anemia of the puerperium: Secondary | ICD-10-CM

## 2019-11-04 DIAGNOSIS — Z20822 Contact with and (suspected) exposure to covid-19: Secondary | ICD-10-CM | POA: Diagnosis present

## 2019-11-04 DIAGNOSIS — Z3490 Encounter for supervision of normal pregnancy, unspecified, unspecified trimester: Secondary | ICD-10-CM

## 2019-11-04 DIAGNOSIS — O368190 Decreased fetal movements, unspecified trimester, not applicable or unspecified: Secondary | ICD-10-CM | POA: Diagnosis present

## 2019-11-04 LAB — CBC WITH DIFFERENTIAL/PLATELET
Abs Immature Granulocytes: 0.55 10*3/uL — ABNORMAL HIGH (ref 0.00–0.07)
Basophils Absolute: 0.1 10*3/uL (ref 0.0–0.1)
Basophils Relative: 1 %
Eosinophils Absolute: 0.1 10*3/uL (ref 0.0–0.5)
Eosinophils Relative: 1 %
HCT: 29 % — ABNORMAL LOW (ref 36.0–46.0)
Hemoglobin: 9.3 g/dL — ABNORMAL LOW (ref 12.0–15.0)
Immature Granulocytes: 4 %
Lymphocytes Relative: 12 %
Lymphs Abs: 1.6 10*3/uL (ref 0.7–4.0)
MCH: 26.6 pg (ref 26.0–34.0)
MCHC: 32.1 g/dL (ref 30.0–36.0)
MCV: 82.9 fL (ref 80.0–100.0)
Monocytes Absolute: 1 10*3/uL (ref 0.1–1.0)
Monocytes Relative: 8 %
Neutro Abs: 9.2 10*3/uL — ABNORMAL HIGH (ref 1.7–7.7)
Neutrophils Relative %: 74 %
Platelets: 193 10*3/uL (ref 150–400)
RBC: 3.5 MIL/uL — ABNORMAL LOW (ref 3.87–5.11)
RDW: 14.9 % (ref 11.5–15.5)
WBC: 12.6 10*3/uL — ABNORMAL HIGH (ref 4.0–10.5)
nRBC: 0 % (ref 0.0–0.2)

## 2019-11-04 LAB — URINE DRUG SCREEN, QUALITATIVE (ARMC ONLY)
Amphetamines, Ur Screen: NOT DETECTED
Barbiturates, Ur Screen: NOT DETECTED
Benzodiazepine, Ur Scrn: NOT DETECTED
Cannabinoid 50 Ng, Ur ~~LOC~~: NOT DETECTED
Cocaine Metabolite,Ur ~~LOC~~: NOT DETECTED
MDMA (Ecstasy)Ur Screen: NOT DETECTED
Methadone Scn, Ur: NOT DETECTED
Opiate, Ur Screen: NOT DETECTED
Phencyclidine (PCP) Ur S: NOT DETECTED
Tricyclic, Ur Screen: NOT DETECTED

## 2019-11-04 LAB — TYPE AND SCREEN
ABO/RH(D): A POS
Antibody Screen: NEGATIVE

## 2019-11-04 LAB — SARS CORONAVIRUS 2 BY RT PCR (HOSPITAL ORDER, PERFORMED IN ~~LOC~~ HOSPITAL LAB): SARS Coronavirus 2: NEGATIVE

## 2019-11-04 LAB — APTT: aPTT: 24 seconds (ref 24–36)

## 2019-11-04 LAB — KLEIHAUER-BETKE STAIN
Fetal Cells %: 13.7 %
Quantitation Fetal Hemoglobin: 0.0693 mL

## 2019-11-04 LAB — ABO/RH: ABO/RH(D): A POS

## 2019-11-04 LAB — PROTIME-INR
INR: 1 (ref 0.8–1.2)
Prothrombin Time: 12.4 seconds (ref 11.4–15.2)

## 2019-11-04 SURGERY — Surgical Case
Anesthesia: General

## 2019-11-04 MED ORDER — ZOLPIDEM TARTRATE 5 MG PO TABS: 5.0000 mg | ORAL_TABLET | Freq: Every evening | ORAL | Status: DC | PRN

## 2019-11-04 MED ORDER — NALBUPHINE HCL 10 MG/ML IJ SOLN: 5.0000 mg | Freq: Once | INTRAMUSCULAR | Status: DC | PRN

## 2019-11-04 MED ORDER — SODIUM CHLORIDE 0.9% FLUSH: 3.0000 mL | INTRAVENOUS | Status: DC | PRN

## 2019-11-04 MED ORDER — ONDANSETRON HCL 4 MG/2ML IJ SOLN: 4.0000 mg | Freq: Three times a day (TID) | INTRAMUSCULAR | Status: DC | PRN

## 2019-11-04 MED ORDER — LACTATED RINGERS IV SOLN
INTRAVENOUS | Status: DC
  Administered 2019-11-04: 1000 mL via INTRAVENOUS

## 2019-11-04 MED ORDER — DIPHENHYDRAMINE HCL 50 MG/ML IJ SOLN: 12.5000 mg | INTRAMUSCULAR | Status: DC | PRN

## 2019-11-04 MED ORDER — KETOROLAC TROMETHAMINE 30 MG/ML IJ SOLN
30.0000 mg | Freq: Four times a day (QID) | INTRAMUSCULAR | Status: DC
  Administered 2019-11-04: 30 mg via INTRAVENOUS
  Filled 2019-11-04: qty 1

## 2019-11-04 MED ORDER — SOD CITRATE-CITRIC ACID 500-334 MG/5ML PO SOLN
ORAL | Status: AC
  Administered 2019-11-04: 30 mL via ORAL
  Filled 2019-11-04: qty 30

## 2019-11-04 MED ORDER — KETOROLAC TROMETHAMINE 30 MG/ML IJ SOLN: 30.0000 mg | Freq: Four times a day (QID) | INTRAMUSCULAR | Status: DC

## 2019-11-04 MED ORDER — DIPHENHYDRAMINE HCL 25 MG PO CAPS: 25.0000 mg | ORAL_CAPSULE | Freq: Four times a day (QID) | ORAL | Status: DC | PRN

## 2019-11-04 MED ORDER — PRENATAL MULTIVITAMIN CH
1.0000 | ORAL_TABLET | Freq: Every day | ORAL | Status: DC
  Administered 2019-11-05 – 2019-11-07 (×3): 1 via ORAL
  Filled 2019-11-04 (×3): qty 1

## 2019-11-04 MED ORDER — MORPHINE SULFATE (PF) 0.5 MG/ML IJ SOLN
INTRAMUSCULAR | Status: AC
  Filled 2019-11-04: qty 10

## 2019-11-04 MED ORDER — ONDANSETRON HCL 4 MG/2ML IJ SOLN
INTRAMUSCULAR | Status: DC | PRN
  Administered 2019-11-04: 4 mg via INTRAVENOUS

## 2019-11-04 MED ORDER — OXYCODONE-ACETAMINOPHEN 5-325 MG PO TABS
1.0000 | ORAL_TABLET | ORAL | Status: DC | PRN
  Administered 2019-11-05 – 2019-11-07 (×6): 1 via ORAL
  Filled 2019-11-04 (×6): qty 1

## 2019-11-04 MED ORDER — MEPERIDINE HCL 25 MG/ML IJ SOLN: 6.2500 mg | INTRAMUSCULAR | Status: DC | PRN

## 2019-11-04 MED ORDER — LACTATED RINGERS IV SOLN: INTRAVENOUS | Status: DC

## 2019-11-04 MED ORDER — FENTANYL CITRATE (PF) 100 MCG/2ML IJ SOLN: 25.0000 ug | INTRAMUSCULAR | Status: DC | PRN

## 2019-11-04 MED ORDER — BUPIVACAINE ON-Q PAIN PUMP (FOR ORDER SET NO CHG)
INJECTION | Status: DC
  Filled 2019-11-04: qty 1

## 2019-11-04 MED ORDER — IBUPROFEN 800 MG PO TABS
800.0000 mg | ORAL_TABLET | Freq: Four times a day (QID) | ORAL | Status: DC
  Administered 2019-11-05 – 2019-11-07 (×8): 800 mg via ORAL
  Filled 2019-11-04 (×8): qty 1

## 2019-11-04 MED ORDER — MORPHINE SULFATE (PF) 2 MG/ML IV SOLN: 1.0000 mg | INTRAVENOUS | Status: DC | PRN

## 2019-11-04 MED ORDER — NALBUPHINE HCL 10 MG/ML IJ SOLN: 5.0000 mg | INTRAMUSCULAR | Status: DC | PRN

## 2019-11-04 MED ORDER — BUPIVACAINE HCL 0.5 % IJ SOLN
10.0000 mL | Freq: Once | INTRAMUSCULAR | Status: DC
  Filled 2019-11-04: qty 10

## 2019-11-04 MED ORDER — DIPHENHYDRAMINE HCL 25 MG PO CAPS: 25.0000 mg | ORAL_CAPSULE | ORAL | Status: DC | PRN

## 2019-11-04 MED ORDER — BUPIVACAINE 0.25 % ON-Q PUMP DUAL CATH 400 ML
400.0000 mL | INJECTION | Status: DC
  Filled 2019-11-04: qty 400

## 2019-11-04 MED ORDER — SODIUM CHLORIDE 0.9 % IV SOLN
INTRAVENOUS | Status: DC | PRN
  Administered 2019-11-04: 50 ug/min via INTRAVENOUS

## 2019-11-04 MED ORDER — BUPIVACAINE IN DEXTROSE 0.75-8.25 % IT SOLN
INTRATHECAL | Status: DC | PRN
  Administered 2019-11-04: 1.6 mL via INTRATHECAL

## 2019-11-04 MED ORDER — NALBUPHINE HCL 10 MG/ML IJ SOLN
5.0000 mg | INTRAMUSCULAR | Status: DC | PRN
  Administered 2019-11-04: 5 mg via INTRAVENOUS
  Filled 2019-11-04: qty 1

## 2019-11-04 MED ORDER — ACETAMINOPHEN 325 MG PO TABS
650.0000 mg | ORAL_TABLET | Freq: Four times a day (QID) | ORAL | Status: AC
  Administered 2019-11-05: 650 mg via ORAL
  Filled 2019-11-04 (×3): qty 2

## 2019-11-04 MED ORDER — NALOXONE HCL 0.4 MG/ML IJ SOLN: 0.4000 mg | INTRAMUSCULAR | Status: DC | PRN

## 2019-11-04 MED ORDER — OXYTOCIN-SODIUM CHLORIDE 30-0.9 UT/500ML-% IV SOLN
INTRAVENOUS | Status: AC
  Filled 2019-11-04: qty 500

## 2019-11-04 MED ORDER — MENTHOL 3 MG MT LOZG
1.0000 | LOZENGE | OROMUCOSAL | Status: DC | PRN
  Filled 2019-11-04: qty 9

## 2019-11-04 MED ORDER — OXYTOCIN-SODIUM CHLORIDE 30-0.9 UT/500ML-% IV SOLN
2.5000 [IU]/h | INTRAVENOUS | Status: AC
  Administered 2019-11-04: 2.5 [IU]/h via INTRAVENOUS

## 2019-11-04 MED ORDER — OXYCODONE HCL 5 MG PO TABS
5.0000 mg | ORAL_TABLET | Freq: Four times a day (QID) | ORAL | Status: AC | PRN
  Administered 2019-11-05: 5 mg via ORAL
  Filled 2019-11-04: qty 1

## 2019-11-04 MED ORDER — NALOXONE HCL 4 MG/10ML IJ SOLN
1.0000 ug/kg/h | INTRAVENOUS | Status: DC | PRN
  Filled 2019-11-04: qty 5

## 2019-11-04 MED ORDER — MORPHINE SULFATE (PF) 0.5 MG/ML IJ SOLN
INTRAMUSCULAR | Status: DC | PRN
  Administered 2019-11-04: .1 mg via EPIDURAL

## 2019-11-04 MED ORDER — ACETAMINOPHEN 325 MG PO TABS
650.0000 mg | ORAL_TABLET | ORAL | Status: DC | PRN
  Administered 2019-11-04 – 2019-11-05 (×3): 650 mg via ORAL
  Filled 2019-11-04: qty 2

## 2019-11-04 MED ORDER — LACTATED RINGERS IV BOLUS
500.0000 mL | Freq: Once | INTRAVENOUS | Status: AC
  Administered 2019-11-04: 500 mL via INTRAVENOUS

## 2019-11-04 MED ORDER — SIMETHICONE 80 MG PO CHEW
80.0000 mg | CHEWABLE_TABLET | ORAL | Status: DC
  Administered 2019-11-05 – 2019-11-07 (×2): 80 mg via ORAL
  Filled 2019-11-04 (×2): qty 1

## 2019-11-04 MED ORDER — FENTANYL CITRATE (PF) 100 MCG/2ML IJ SOLN
INTRAMUSCULAR | Status: AC
  Filled 2019-11-04: qty 2

## 2019-11-04 MED ORDER — PROPOFOL 10 MG/ML IV BOLUS
INTRAVENOUS | Status: AC
  Filled 2019-11-04: qty 20

## 2019-11-04 MED ORDER — OXYCODONE HCL 5 MG/5ML PO SOLN
5.0000 mg | Freq: Once | ORAL | Status: DC | PRN
  Filled 2019-11-04: qty 5

## 2019-11-04 MED ORDER — BUPIVACAINE HCL (PF) 0.5 % IJ SOLN
INTRAMUSCULAR | Status: AC
  Filled 2019-11-04: qty 30

## 2019-11-04 MED ORDER — ONDANSETRON HCL 4 MG/2ML IJ SOLN: 4.0000 mg | Freq: Once | INTRAMUSCULAR | Status: DC | PRN

## 2019-11-04 MED ORDER — OXYMETAZOLINE HCL 0.05 % NA SOLN
NASAL | Status: AC
  Filled 2019-11-04: qty 30

## 2019-11-04 MED ORDER — OXYTOCIN-SODIUM CHLORIDE 30-0.9 UT/500ML-% IV SOLN
INTRAVENOUS | Status: DC | PRN
  Administered 2019-11-04: 300 mL via INTRAVENOUS

## 2019-11-04 MED ORDER — KETOROLAC TROMETHAMINE 30 MG/ML IJ SOLN
30.0000 mg | Freq: Four times a day (QID) | INTRAMUSCULAR | Status: AC
  Administered 2019-11-04 – 2019-11-05 (×3): 30 mg via INTRAVENOUS
  Filled 2019-11-04 (×3): qty 1

## 2019-11-04 MED ORDER — SENNOSIDES-DOCUSATE SODIUM 8.6-50 MG PO TABS
2.0000 | ORAL_TABLET | ORAL | Status: DC
  Administered 2019-11-05 – 2019-11-07 (×3): 2 via ORAL
  Filled 2019-11-04 (×3): qty 2

## 2019-11-04 MED ORDER — FENTANYL CITRATE (PF) 100 MCG/2ML IJ SOLN
INTRAMUSCULAR | Status: DC | PRN
  Administered 2019-11-04: 10 ug via INTRAVENOUS

## 2019-11-04 MED ORDER — DIBUCAINE (PERIANAL) 1 % EX OINT: 1.0000 "application " | TOPICAL_OINTMENT | CUTANEOUS | Status: DC | PRN

## 2019-11-04 MED ORDER — SODIUM CHLORIDE 0.9 % IV SOLN
2.0000 g | INTRAVENOUS | Status: AC
  Administered 2019-11-04: 2 g via INTRAVENOUS
  Filled 2019-11-04 (×3): qty 2

## 2019-11-04 MED ORDER — OXYCODONE HCL 5 MG PO TABS: 5.0000 mg | ORAL_TABLET | Freq: Once | ORAL | Status: DC | PRN

## 2019-11-04 MED ORDER — SOD CITRATE-CITRIC ACID 500-334 MG/5ML PO SOLN: 30.0000 mL | ORAL | Status: DC

## 2019-11-04 MED ORDER — WITCH HAZEL-GLYCERIN EX PADS: 1.0000 "application " | MEDICATED_PAD | CUTANEOUS | Status: DC | PRN

## 2019-11-04 MED ORDER — SIMETHICONE 80 MG PO CHEW
80.0000 mg | CHEWABLE_TABLET | ORAL | Status: DC | PRN
  Administered 2019-11-04: 80 mg via ORAL
  Filled 2019-11-04: qty 1

## 2019-11-04 MED ORDER — SIMETHICONE 80 MG PO CHEW
80.0000 mg | CHEWABLE_TABLET | Freq: Three times a day (TID) | ORAL | Status: DC
  Administered 2019-11-05 – 2019-11-07 (×8): 80 mg via ORAL
  Filled 2019-11-04 (×8): qty 1

## 2019-11-04 MED ORDER — OXYTOCIN-SODIUM CHLORIDE 30-0.9 UT/500ML-% IV SOLN
INTRAVENOUS | Status: AC
  Filled 2019-11-04: qty 1000

## 2019-11-04 MED ORDER — IRON DEXTRAN 50 MG/ML IJ SOLN
50.0000 mg | Freq: Once | INTRAMUSCULAR | Status: DC
  Filled 2019-11-04: qty 1

## 2019-11-04 MED ORDER — ACETAMINOPHEN 10 MG/ML IV SOLN
INTRAVENOUS | Status: AC
  Filled 2019-11-04: qty 100

## 2019-11-04 MED ORDER — POVIDONE-IODINE 10 % EX SWAB: 2.0000 "application " | Freq: Once | CUTANEOUS | Status: DC

## 2019-11-04 MED ORDER — COCONUT OIL OIL
1.0000 "application " | TOPICAL_OIL | Status: DC | PRN
  Administered 2019-11-04: 1 via TOPICAL
  Filled 2019-11-04: qty 120

## 2019-11-04 SURGICAL SUPPLY — 27 items
CANISTER SUCT 3000ML PPV (MISCELLANEOUS) ×2 IMPLANT
CATH KIT ON-Q SILVERSOAK 5IN (CATHETERS) ×4 IMPLANT
CHLORAPREP W/TINT 26 (MISCELLANEOUS) ×4 IMPLANT
COVER WAND RF STERILE (DRAPES) ×2 IMPLANT
DERMABOND ADHESIVE PROPEN (GAUZE/BANDAGES/DRESSINGS) ×1
DERMABOND ADVANCED (GAUZE/BANDAGES/DRESSINGS) ×1
DERMABOND ADVANCED .7 DNX12 (GAUZE/BANDAGES/DRESSINGS) ×1 IMPLANT
DERMABOND ADVANCED .7 DNX6 (GAUZE/BANDAGES/DRESSINGS) ×1 IMPLANT
ELECT CAUTERY BLADE 6.4 (BLADE) ×2 IMPLANT
ELECT REM PT RETURN 9FT ADLT (ELECTROSURGICAL) ×2
ELECTRODE REM PT RTRN 9FT ADLT (ELECTROSURGICAL) ×1 IMPLANT
GLOVE SKINSENSE NS SZ8.0 LF (GLOVE) ×1
GLOVE SKINSENSE STRL SZ8.0 LF (GLOVE) ×1 IMPLANT
GOWN STRL REUS W/ TWL LRG LVL3 (GOWN DISPOSABLE) ×1 IMPLANT
GOWN STRL REUS W/ TWL XL LVL3 (GOWN DISPOSABLE) ×2 IMPLANT
GOWN STRL REUS W/TWL LRG LVL3 (GOWN DISPOSABLE) ×1
GOWN STRL REUS W/TWL XL LVL3 (GOWN DISPOSABLE) ×2
NS IRRIG 1000ML POUR BTL (IV SOLUTION) ×2 IMPLANT
PACK C SECTION (MISCELLANEOUS) ×2 IMPLANT
PAD OB MATERNITY 4.3X12.25 (PERSONAL CARE ITEMS) ×2 IMPLANT
PAD PREP 24X41 OB/GYN DISP (PERSONAL CARE ITEMS) ×2 IMPLANT
PENCIL SMOKE ULTRAEVAC 22 CON (MISCELLANEOUS) ×2 IMPLANT
SUT MAXON ABS #0 GS21 30IN (SUTURE) ×4 IMPLANT
SUT VIC AB 1 CT1 36 (SUTURE) ×6 IMPLANT
SUT VIC AB 2-0 CT1 36 (SUTURE) ×2 IMPLANT
SUT VIC AB 4-0 FS2 27 (SUTURE) ×2 IMPLANT
TAPE STRIPS DRAPE STRL (GAUZE/BANDAGES/DRESSINGS) ×2 IMPLANT

## 2019-11-04 NOTE — Op Note (Signed)
Cesarean Section Procedure Note Indications: abruptio placenta and term intrauterine pregnancy  Pre-operative Diagnosis: Intrauterine pregnancy [redacted]w[redacted]d ;  abruptio placenta and term intrauterine pregnancy Post-operative Diagnosis: same, delivered. Procedure: Low Transverse Cesarean Section Surgeon: Barnett Applebaum, MD, FACOG Assistant(s): Dr Glennon Mac, No other capable assistant available, in surgery requiring high level assistant. Anesthesia: Spinal anesthesia Estimated Blood RAXE:940 mL Complications: None; patient tolerated the procedure well. Disposition: PACU - hemodynamically stable. Condition: stable  Findings: A female infant in the cephalic presentation.  "Thomas" Amniotic fluid - Clear  Birth weight 6-2lbs.  Apgars of 7 and 8.  Intact placenta with a three-vessel cord. Grossly normal uterus, tubes and ovaries bilaterally. No intraabdominal adhesions were noted.  No sign of abruption w the placenta.  Procedure Details   The patient was taken to Operating Room, identified as the correct patient and the procedure verified as C-Section Delivery. A Time Out was held and the above information confirmed. After induction of anesthesia, the patient was draped and prepped in the usual sterile manner. A Pfannenstiel incision was made and carried down through the subcutaneous tissue to the fascia. Fascial incision was made and extended transversely with the Mayo scissors. The fascia was separated from the underlying rectus tissue superiorly and inferiorly. The peritoneum was identified and entered bluntly. Peritoneal incision was extended longitudinally. The utero-vesical peritoneal reflection was incised transversely and a bladder flap was created digitally.  A low transverse hysterotomy was made. The fetus was delivered atraumatically. The umbilical cord was clamped x2 and cut and the infant was handed to the awaiting pediatricians. The placenta was removed intact and appeared normal with a 3-vessel  cord.  The uterus was exteriorized and cleared of all clot and debris. The hysterotomy was closed with running sutures of 0 Vicryl suture. A second imbricating layer was placed with the same suture. Excellent hemostasis was observed. The uterus was returned to the abdomen. The pelvis was irrigated and again, excellent hemostasis was noted.  The On Q Pain pump System was then placed.  Trocars were placed through the abdominal wall into the subfascial space and these were used to thread the silver soaker cathaters into place.The rectus fascia was then reapproximated with running sutures of Maxon, with careful placement not to incorporate the cathaters. Subcutaneous tissues are then irrigated with saline and hemostasis assured.  Skin is then closed with 4-0 vicryl suture in a subcuticular fashion followed by skin adhesive. The cathaters are flushed each with 5 mL of Bupivicaine and stabilized into place with dressing. Instrument, sponge, and needle counts were correct prior to the abdominal closure and at the conclusion of the case. The surgical assistant performed tissue retraction, assistance with suturing, and fundal pressure.   The patient tolerated the procedure well and was transferred to the recovery room in stable condition.   Barnett Applebaum, MD, [facility], [facility] 11/04/2019  3:58 PM

## 2019-11-04 NOTE — Telephone Encounter (Signed)
Pt tx'd to me from SP who states CRS adv pt to go to L&D.  Pt c/o decreased FM that started yesterday; has eaten snacks and drank some caffeine c no improvement. Pt had a bad H/A last night as well.  Misty aware in L&D.

## 2019-11-04 NOTE — H&P (Signed)
OB History & Physical   History of Present Illness:  Chief Complaint: decreased fetal movement  HPI:  Cynthia Stevenson is a 19 y.o. G1P0 female at [redacted]w[redacted]d dated by 9 week ultrasound.  Her pregnancy has been uncomplicated.  She did have Covid in February of this year and received the covid vaccine in May. She denies any complications from either the illness or the vaccine.  She reports occasional contractions.   She denies leakage of fluid.   She denies vaginal bleeding.   She reports decreased fetal movement since yesterday. She did fetal kick counts and still had limited movement yesterday. This morning she checked heart tones with home doppler and felt a little bit of movement. She was told by office staff to come to the hospital for evaluation.     Total weight gain for pregnancy: 15.7 kg   Obstetrical Problem List: pregnancy 1  Problems (from 02/07/19 to present)    Problem Noted Resolved   Encounter for supervision of low-risk pregnancy, antepartum 05/16/2019 by Cynthia Dry, MD No   Overview Addendum 10/16/2019  2:26 PM by Cynthia Dry, MD    [facility]inic [facility] Prenatal Labs  Dating 9 week [place] Blood type: A/Positive/-- (01/08 1520)   Genetic Screen NIPS: normal XY Antibody:Negative (01/08 1520)  Anatomic [place] Complete, normal 3/17 Rubella: 1.64 (01/08 1520) Varicella: Immune  GTT 111 RPR: Non Reactive (01/08 1520)   Rhogam  HBsAg: Negative (01/08 1520)   TDaP vaccine                       Flu Shot: HIV: Non Reactive (01/08 1520)   Baby Food   Breast                             GBS:   Contraception   Paraguard IUD Pap: N/A, do PP  CBB  no   CS/VBAC n/a   Support Person            Previous Version       Maternal Medical History:   Past Medical History:  Diagnosis Date  . Cancer ([place])    melanoma 2016    Past Surgical History:  Procedure Laterality Date  . MELANOMA EXCISION  2017    No Known Allergies  Prior to Admission medications   Not on File    OB History   Gravida Para Term Preterm AB Living  1            SAB TAB Ectopic Multiple Live Births               # Outcome Date GA Lbr Len/2nd Weight Sex Delivery Anes PTL Lv  1 Current             Prenatal care site: [facility]  Social History: She  reports that she has never smoked. She has never used smokeless tobacco. She reports that she does not drink alcohol and does not use drugs.  Family History: family history includes Pancreatic cancer in her maternal grandmother.    Review of Systems:  Review of Systems  Constitutional: Negative for chills and fever.  HENT: Negative for congestion, ear discharge, ear pain, hearing loss, sinus pain and sore throat.   Eyes: Negative for blurred vision and double vision.  Respiratory: Negative for cough, shortness of breath and wheezing.   Cardiovascular: Negative for chest pain, palpitations and leg swelling.  Gastrointestinal: Negative for  abdominal pain, blood in stool, constipation, diarrhea, heartburn, melena, nausea and vomiting.  Genitourinary: Negative for dysuria, flank pain, frequency, hematuria and urgency.  Musculoskeletal: Positive for back pain. Negative for joint pain and myalgias.  Skin: Negative for itching and rash.  Neurological: Negative for dizziness, tingling, tremors, sensory change, speech change, focal weakness, seizures, loss of consciousness, weakness and headaches.  Endo/Heme/Allergies: Negative for environmental allergies. Does not bruise/bleed easily.  Psychiatric/Behavioral: Negative for depression, hallucinations, memory loss, substance abuse and suicidal ideas. The patient is not nervous/anxious and does not have insomnia.      Physical Exam:  BP 110/68 (BP Location: Left Arm)   Pulse 94   Temp 97.8 F (36.6 C) (Oral)   Resp 12   LMP 02/07/2019 (Approximate)   Vital Signs: BP 110/68 (BP Location: Left Arm)   Pulse 94   Temp 97.8 F (36.6 C) (Oral)   Resp 12   LMP 02/07/2019 (Approximate)   Constitutional: Well nourished, well developed female in no acute distress.  HEENT: normal Skin: Warm and Stevenson.  Cardiovascular: Regular rate and rhythm.   Extremity: no edema  Respiratory: Clear to auscultation bilateral. Normal respiratory effort Abdomen: FHT present Back: no CVAT Neuro: DTRs 2+, Cranial nerves grossly intact Psych: Alert and Oriented x3. No memory deficits. Normal mood and affect.  MS: normal gait, normal bilateral lower extremity ROM/strength/stability.  Pelvic exam: deferred    Pertinent Results:  Prenatal Labs Blood type/Rh A positive  Antibody screen negative  Rubella Immune  Varicella Immune    RPR Non-reactive  HBsAg negative  HIV negative  GC negative  Chlamydia negative  Genetic screening Negative/female  1 hour GTT 111  3 hour GTT NA  GBS negative on 6/21   Baseline FHR: 150 beats/min   Variability: minimal    Accelerations: present   Decelerations: present- some variables and 2 minute gradual deceleration to 60 with return to baseline, primarily sinusoidal pattern Contractions: present frequency: occasional, irregular Overall assessment: abnormal FHR pattern, continue assessment  Bedside Ultrasound: awaiting report- initial report from tech shows limited movement and low/normal AFI of 5, MVP is less than 2x2  Results for Cynthia Stevenson (MRN [id number]) as of 11/04/2019 11:45  Ref. Range 11/04/2019 10:25 11/04/2019 10:34 11/04/2019 10:57  WBC Latest Ref Range: 4.0 - 10.5 K/uL  12.6 (H)   RBC Latest Ref Range: 3.87 - 5.11 MIL/uL  3.50 (L)   Hemoglobin Latest Ref Range: 12.0 - 15.0 g/dL  9.3 (L)   HCT Latest Ref Range: 36 - 46 %  29.0 (L)   MCV Latest Ref Range: 80.0 - 100.0 fL  82.9   MCH Latest Ref Range: 26.0 - 34.0 pg  26.6   MCHC Latest Ref Range: 30.0 - 36.0 g/dL  32.1   RDW Latest Ref Range: 11.5 - 15.5 %  14.9   Platelets Latest Ref Range: 150 - 400 K/uL  193   nRBC Latest Ref Range: 0.0 - 0.2 %  0.0   Neutrophils Latest Units: %  74    Lymphocytes Latest Units: %  12   Monocytes Relative Latest Units: %  8   Eosinophil Latest Units: %  1   Basophil Latest Units: %  1   Immature Granulocytes Latest Units: %  4   NEUT# Latest Ref Range: 1.7 - 7.7 K/uL  9.2 (H)   Lymphocyte # Latest Ref Range: 0.7 - 4.0 K/uL  1.6   Monocyte # Latest Ref Range: 0 - 1 K/uL  1.0  Eosinophils Absolute Latest Ref Range: 0 - 0 K/uL  0.1   Basophils Absolute Latest Ref Range: 0 - 0 K/uL  0.1   Abs Immature Granulocytes Latest Ref Range: 0.00 - 0.07 K/uL  0.55 (H)   Sample Expiration Unknown  11/07/2019,2359...   Antibody Screen Unknown  PENDING   ABO/RH(D) Unknown  PENDING   Amphetamines, Ur Screen Latest Ref Range: NONE DETECTED  NONE DETECTED    Barbiturates, Ur Screen Latest Ref Range: NONE DETECTED  NONE DETECTED    Benzodiazepine, Ur Scrn Latest Ref Range: NONE DETECTED  NONE DETECTED    Cocaine Metabolite,Ur [place] Latest Ref Range: NONE DETECTED  NONE DETECTED    Methadone Scn, Ur Latest Ref Range: NONE DETECTED  NONE DETECTED    MDMA (Ecstasy)Ur Screen Latest Ref Range: NONE DETECTED  NONE DETECTED    Cannabinoid 50 Ng, Ur [place] Latest Ref Range: NONE DETECTED  NONE DETECTED    Opiate, Ur Screen Latest Ref Range: NONE DETECTED  NONE DETECTED    Phencyclidine (PCP) Ur S Latest Ref Range: NONE DETECTED  NONE DETECTED    Tricyclic, Ur Screen Latest Ref Range: NONE DETECTED  NONE DETECTED    US OB LIMITED Unknown   Rpt    Lab Results  Component Value Date   SARSCOV2NAA POSITIVE (A) 07/02/2019  ]  Assessment:  Cynthia Stevenson is a 19 y.o. G1P0 female at [redacted]w[redacted]d admitted for observation and evaluation of decreased fetal movement and abnormal FHR pattern.   Plan:  1. Admit to Labor & Delivery for observation 2. CBC, T&S, IVF, KB 3. GBS negative.   4. Fetal well-being: sinusoidal tracing with decel noted and low/normal AFI 5. Bedside ultrasound ordered 6. Continue monitoring, patient aware that delivery based on findings is a  possibility  Rod Can, CNM 11/04/2019 11:25 AM

## 2019-11-04 NOTE — Transfer of Care (Signed)
Immediate Anesthesia Transfer of Care Note  Patient: Cynthia Stevenson  Procedure(s) Performed: CESAREAN SECTION (N/A )  Patient Location: PACU and Nursing Unit  Anesthesia Type:Spinal  Level of Consciousness: awake, alert , oriented and patient cooperative  Airway & Oxygen Therapy: Patient Spontanous Breathing  Post-op Assessment: Report given to RN and Post -op Vital signs reviewed and stable  Post vital signs: Reviewed and stable  Last Vitals:  Vitals Value Taken Time  BP    Temp    Pulse    Resp    SpO2      Last Pain:  Vitals:   11/04/19 0914  TempSrc:   PainSc: 4          Complications: No complications documented.

## 2019-11-04 NOTE — Anesthesia Preprocedure Evaluation (Addendum)
Anesthesia Evaluation  Patient identified by MRN, date of birth, ID band Patient awake    Reviewed: Allergy & Precautions, H&P , NPO status , Patient's Chart, lab work & pertinent test results  History of Anesthesia Complications Negative for: history of anesthetic complications  Airway Mallampati: II  TM Distance: >3 FB Neck ROM: full    Dental  (+) Chipped   Pulmonary neg pulmonary ROS, neg shortness of breath,    Pulmonary exam normal        Cardiovascular Exercise Tolerance: Good negative cardio ROS Normal cardiovascular exam     Neuro/Psych negative neurological ROS  negative psych ROS   GI/Hepatic negative GI ROS, Neg liver ROS, neg GERD  ,  Endo/Other  negative endocrine ROS  Renal/GU negative Renal ROS  negative genitourinary   Musculoskeletal   Abdominal Normal abdominal exam  (+)   Peds negative pediatric ROS (+)  Hematology  (+) Blood dyscrasia, anemia ,   Anesthesia Other Findings Abruption  Past Medical History: No date: Cancer ([place])     Comment:  melanoma 2016  Reproductive/Obstetrics (+) Pregnancy                            Anesthesia Physical Anesthesia Plan  ASA: III and emergent  Anesthesia Plan: Spinal   Post-op Pain Management:    Induction: Intravenous  PONV Risk Score and Plan:   Airway Management Planned: Natural Airway and Nasal Cannula  Additional Equipment:   Intra-op Plan:   Post-operative Plan: Extubation in OR  Informed Consent: I have reviewed the patients History and Physical, chart, labs and discussed the procedure including the risks, benefits and alternatives for the proposed anesthesia with the patient or authorized representative who has indicated his/her understanding and acceptance.     Dental advisory given  Plan Discussed with: CRNA and Surgeon  Anesthesia Plan Comments: (Most recent BP was 120/60 so plan for spinal at this  time  Patient reports no bleeding problems and no anticoagulant use.  Plan for spinal with backup GA  Patient consented for risks of anesthesia including but not limited to:  - adverse reactions to medications - damage to eyes, teeth, lips or other oral mucosa - nerve damage due to positioning  - risk of bleeding, infection and or nerve damage from spinal that could lead to paralysis - risk of headache or failed spinal - damage to teeth, lips or other oral mucosa - sore throat or hoarseness - damage to heart, brain, nerves, lungs, other parts of body or loss of life  Patient voiced understanding.)       Anesthesia Quick Evaluation

## 2019-11-04 NOTE — Anesthesia Procedure Notes (Signed)
Date/Time: 11/04/2019 3:40 PM Performed by: Nelda Marseille, CRNA Pre-anesthesia Checklist: Patient identified, Emergency Drugs available, Suction available, Patient being monitored and Timeout performed Oxygen Delivery Method: Nasal cannula

## 2019-11-04 NOTE — Discharge Summary (Signed)
Postpartum Discharge Summary       Patient Name: Cynthia Stevenson DOB: 2000/08/21 MRN: [id number]  Date of admission: 11/04/2019 Delivery date:11/04/2019  Delivering provider: Gae Dry  Date of discharge: 11/07/2019  Admitting diagnosis: Decreased fetal movement [O36.8190] Antepartum placental abruption [O45.90] Intrauterine pregnancy: [redacted]w[redacted]d    Secondary diagnosis:  none Additional problems: None    Discharge diagnosis: Term Pregnancy Delivered, Placental abruption, postpartum anemia                                            Post partum procedures:none Augmentation: N/A Complications: None  Hospital course: Onset of Labor With Unplanned C/S   19y.o. yo G1P1001 at 352w4das admitted in Latent Labor with rare contractions but decels noted with each ctx, as well as sinusoidal fetal heart rate pattern and elevated KP test, on 11/04/2019. The patient went for cesarean section due to concern for Abruption. Delivery details as follows: Membrane Rupture Time/Date: 3:23 PM ,11/04/2019   Delivery Method:C-Section, Low Transverse  Details of operation can be found in separate operative note. Patient had an uncomplicated postpartum course.  She is ambulating,tolerating a regular diet, passing flatus, and urinating well.  Patient is discharged home in stable condition 11/07/19.  Newborn Data: Birth date:11/04/2019  Birth time:3:23 PM  Gender:Female  Living status:Living  Apgars:7 ,8  Weight:2780 g   Magnesium Sulfate received: No BMZ received: No Rhophylac:N/A MMR:No T-DaP:Given prenatally Flu: No Transfusion:No  Physical exam  Vitals:   11/06/19 1148 11/06/19 1529 11/07/19 0045 11/07/19 0738  BP: 110/66 110/65 128/78 114/73  Pulse: 69 82 89 66  Resp: '19 18 17 18  '$ Temp: 97.9 F (36.6 C) 98.7 F (37.1 C) 99 F (37.2 C) 98.4 F (36.9 C)  TempSrc: Oral Oral Oral Oral  SpO2:  100% 99% 99%  Weight:      Height:       General: alert, cooperative and no distress Lochia:  appropriate Uterine Fundus: firm/ U-2/ ML/ NT Incision: Dressing is clean, dry, and intact DVT Evaluation: No evidence of DVT seen on physical exam. Labs: Lab Results  Component Value Date   WBC 13.7 (H) 11/05/2019   HGB 8.4 (L) 11/05/2019   HCT 25.6 (L) 11/05/2019   MCV 82.1 11/05/2019   PLT 177 11/05/2019   CMP Latest Ref Rng & Units 07/01/2019  Glucose 70 - 99 mg/dL 85  BUN 6 - 20 mg/dL 10  Creatinine 0.44 - 1.00 mg/dL 0.59  Sodium 135 - 145 mmol/L 133(L)  Potassium 3.5 - 5.1 mmol/L 3.4(L)  Chloride 98 - 111 mmol/L 101  CO2 22 - 32 mmol/L 22  Calcium 8.9 - 10.3 mg/dL 8.2(L)  Total Protein 6.5 - 8.1 g/dL 7.1  Total Bilirubin 0.3 - 1.2 mg/dL 0.4  Alkaline Phos 38 - 126 U/L 45  AST 15 - 41 U/L 21  ALT 0 - 44 U/L 16   Edinburgh Score: Edinburgh Postnatal Depression Scale Screening Tool 11/05/2019  I have been able to laugh and see the funny side of things. 0  I have looked forward with enjoyment to things. 0  I have blamed myself unnecessarily when things went wrong. 1  I have been anxious or worried for no good reason. 1  I have felt scared or panicky for no good reason. 0  Things have been getting on top of me. 0  I have been so unhappy that I have had difficulty sleeping. 0  I have felt sad or miserable. 0  I have been so unhappy that I have been crying. 0  The thought of harming myself has occurred to me. 0  Edinburgh Postnatal Depression Scale Total 2      After visit meds:  Allergies as of 11/07/2019   No Known Allergies     Medication List    TAKE these medications   ibuprofen 600 MG tablet Commonly known as: ADVIL Take 1 tablet (600 mg total) by mouth every 6 (six) hours as needed for mild pain, moderate pain or cramping.   iron polysaccharides 150 MG capsule Commonly known as: NIFEREX Take 1 capsule (150 mg total) by mouth daily. Start taking on: November 08, 2019   oxyCODONE-acetaminophen 5-325 MG tablet Commonly known as: PERCOCET/ROXICET Take 1-2  tablets by mouth every 6 (six) hours as needed for up to 7 days for moderate pain or severe pain.   prenatal multivitamin Tabs tablet Take 1 tablet by mouth daily at 12 noon. Start taking on: November 08, 2019        Discharge home in stable condition Infant Feeding: Breast/ NG/ Donor milk Infant Disposition:NICU Discharge instruction: per After Visit Summary and Postpartum booklet. Activity: Advance as tolerated. Pelvic rest for 6 weeks.  Diet: routine diet Anticipated Birth Control: IUD Postpartum Appointment:1 week incision check Additional Postpartum F/U:  Future Appointments: Future Appointments  Date Time Provider [place]  11/14/2019  2:20 PM Gae Dry, MD [facility] None   Follow up Visit:  Follow-up Information    Gae Dry, MD. Go on 11/14/2019.   Specialty: Obstetrics and Gynecology Why: for Post Op @ 220 pm Contact information: [address] [place] [address] [phone]                   11/07/2019 Dalia Heading, CNM

## 2019-11-04 NOTE — Anesthesia Procedure Notes (Signed)
Spinal  Patient location during procedure: OR Start time: 11/04/2019 3:05 PM End time: 11/04/2019 3:11 PM Staffing Performed: resident/CRNA  Resident/CRNA: Nelda Marseille, CRNA Preanesthetic Checklist Completed: patient identified, IV checked, site marked, risks and benefits discussed, surgical consent, monitors and equipment checked, pre-op evaluation and timeout performed Spinal Block Patient position: sitting Prep: Betadine Patient monitoring: heart rate, continuous pulse ox, blood pressure and cardiac monitor Approach: midline Location: L4-5 Injection technique: single-shot Needle Needle type: Whitacre and Introducer  Needle gauge: 25 G Needle length: 9 cm Additional Notes Negative paresthesia. Negative blood return. Positive free-flowing CSF. Expiration date of kit checked and confirmed. Patient tolerated procedure well, without complications.

## 2019-11-04 NOTE — OB Triage Note (Signed)
Pt reports not feeling baby move as much over the last 24 hours.

## 2019-11-05 ENCOUNTER — Encounter: Payer: Medicaid Other | Admitting: Obstetrics

## 2019-11-05 LAB — CBC
HCT: 25.6 % — ABNORMAL LOW (ref 36.0–46.0)
Hemoglobin: 8.4 g/dL — ABNORMAL LOW (ref 12.0–15.0)
MCH: 26.9 pg (ref 26.0–34.0)
MCHC: 32.8 g/dL (ref 30.0–36.0)
MCV: 82.1 fL (ref 80.0–100.0)
Platelets: 177 10*3/uL (ref 150–400)
RBC: 3.12 MIL/uL — ABNORMAL LOW (ref 3.87–5.11)
RDW: 15 % (ref 11.5–15.5)
WBC: 13.7 10*3/uL — ABNORMAL HIGH (ref 4.0–10.5)
nRBC: 0 % (ref 0.0–0.2)

## 2019-11-05 LAB — RPR: RPR Ser Ql: NONREACTIVE

## 2019-11-05 NOTE — Plan of Care (Signed)
Progressing with plan of care. Pain controlled with medication. Pumping every 3 hrs for infant in SCN.

## 2019-11-05 NOTE — Anesthesia Postprocedure Evaluation (Signed)
Anesthesia Post Note  Patient: Cynthia Stevenson  Procedure(s) Performed: CESAREAN SECTION (N/A )  Patient location during evaluation: Mother Baby Anesthesia Type: Spinal Level of consciousness: awake and alert Pain management: pain level controlled Vital Signs Assessment: post-procedure vital signs reviewed and stable Respiratory status: spontaneous breathing, nonlabored ventilation and respiratory function stable Cardiovascular status: stable Postop Assessment: no headache, no backache and epidural receding Anesthetic complications: no   No complications documented.   Last Vitals:  Vitals:   11/05/19 0354 11/05/19 0807  BP: 112/70 113/65  Pulse: 77 68  Resp: 18 18  Temp: 36.8 C (!) 36.4 C  SpO2: 98% 99%    Last Pain:  Vitals:   11/05/19 0807  TempSrc: Oral  PainSc:                  Estill Batten

## 2019-11-05 NOTE — Progress Notes (Signed)
Obstetric Postpartum/PostOperative Daily Progress Note Subjective:  19 y.o. G1P1001 post-operative day # 1 status post primary cesarean delivery.  She is ambulating, is tolerating po, is voiding spontaneously.  Her pain is well controlled on PO pain medications and On Q pump. Her lochia is less than menses. She reports breastfeeding is going well. She is on her way to SCN to see baby after our visit.    Medications SCHEDULED MEDICATIONS   acetaminophen  650 mg Oral Q6H   ketorolac  30 mg Intravenous Q6H   Followed by   ibuprofen  800 mg Oral Q6H   prenatal multivitamin  1 tablet Oral Q1200   senna-docusate  2 tablet Oral Q24H   simethicone  80 mg Oral TID PC   [START ON 11/06/2019] simethicone  80 mg Oral Q24H    MEDICATION INFUSIONS   bupivacaine ON-Q pain pump     lactated ringers     naLOXone (NARCAN) adult infusion for PRURITIS     oxytocin Stopped (11/05/19 0746)    PRN MEDICATIONS  acetaminophen, coconut oil, witch hazel-glycerin **AND** dibucaine, diphenhydrAMINE **OR** diphenhydrAMINE, menthol-cetylpyridinium, morphine injection, nalbuphine **OR** nalbuphine, nalbuphine **OR** nalbuphine, naloxone **AND** sodium chloride flush, naLOXone (NARCAN) adult infusion for PRURITIS, ondansetron (ZOFRAN) IV, oxyCODONE, oxyCODONE-acetaminophen, simethicone, zolpidem    Objective:   Vitals:   11/05/19 0354 11/05/19 0700 11/05/19 0807 11/05/19 0900  BP: 112/70  113/65   Pulse: 77 72 68 84  Resp: 18  18   Temp: 98.2 F (36.8 C)  (!) 97.5 F (36.4 C)   TempSrc: Oral  Oral   SpO2: 98% 93% 99% 97%    Current Vital Signs 24h Vital Sign Ranges  T (!) 97.5 F (36.4 C) Temp  Avg: 98.1 F (36.7 C)  Min: 97.5 F (36.4 C)  Max: 98.9 F (37.2 C)  BP 113/65 BP  Min: 93/46  Max: 120/75  HR 84 Pulse  Avg: 75.9  Min: 66  Max: 88  RR 18 Resp  Avg: 17  Min: 12  Max: 18  SaO2 97 % Room Air SpO2  Avg: 98.4 %  Min: 93 %  Max: 100 %       24 Hour I/O Current Shift I/O  Time Ins Outs  06/29 0701 - 06/30 0700 In: -  Out: 1844 [Urine:1050] 06/30 0701 - 06/30 1900 In: 487.2 [I.V.:487.2] Out: 925 [Urine:925]  General: NAD Pulmonary: no increased work of breathing Abdomen: non-distended, non-tender, fundus firm at level of umbilicus Inc: Clean/dry/intact, On Q pump intact Extremities: no edema, no erythema, no tenderness  Labs:  Recent Labs  Lab 11/04/19 1034 11/05/19 0506  WBC 12.6* 13.7*  HGB 9.3* 8.4*  HCT 29.0* 25.6*  PLT 193 177     Assessment:   19 y.o. G1P1001 postoperative day # 1 status post primary cesarean section, lactating  Plan:  1) Acute blood loss anemia - hemodynamically stable and asymptomatic - po ferrous sulfate  2) A POS Performed at [facility], [address] [place], [place] [address]  / Charlynn Grimes 1.64 (01/08 1520)/ Varicella Immune  3) TDAP status needs prior to discharge  4) breast /Contraception = IUD  5) Disposition: continue current care   Rod Can, CNM 11/05/2019 11:53 AM

## 2019-11-05 NOTE — Lactation Note (Signed)
This note was copied from a baby's chart. Lactation Consultation Note  Patient Name: Cynthia Stevenson YOVZC'H Date: 11/05/2019 Reason for consult: Initial assessment;NICU baby;1st time breastfeeding;Early term 37-38.6wks;Other (Comment) (emergency c/s)  LC in to see mom. Mom is G1P1 delivered via c/s yesterday to her first baby. Baby is in SCN. Mom was set-up with DEBP by RN's yesterday evening, and has been diligent with pumping. Mom reports that she received small volumes 2 out of 3 of the last times she pumped; and is currently pumping at this time. Mom is confident in understanding pump use, set-up, and cleaning, and has Dad in the room to assist as needed. LC reviewed recommendations for pumping 8-12x in 24 hours for 15 minutes, reviewed the initiation and power buttons on the pump, use of coconut oil to lubricate between plastic and skin, and labeling of milk that is expressed.  LC name/number available on whiteboard; mom encouraged to call out today with any questions and/or concerns or for assistance if needed.  Maternal Data Formula Feeding for Exclusion: No Has patient been taught Hand Expression?: Yes Does the patient have breastfeeding experience prior to this delivery?: No  Feeding    LATCH Score                   Interventions Interventions: Breast feeding basics reviewed;DEBP  Lactation Tools Discussed/Used Tools: Pump Breast pump type: Double-Electric Breast Pump Pump Review: Setup, frequency, and cleaning;Milk Storage Date initiated:: 11/04/19   Consult Status Consult Status: PRN    [name] 11/05/2019, 10:07 AM

## 2019-11-05 NOTE — Anesthesia Post-op Follow-up Note (Signed)
  Anesthesia Pain Follow-up Note  Patient: Cynthia Stevenson  Day #: 1  Date of Follow-up: 11/05/2019 Time: 8:39 AM  Last Vitals:  Vitals:   11/05/19 0354 11/05/19 0807  BP: 112/70 113/65  Pulse: 77 68  Resp: 18 18  Temp: 36.8 C (!) 36.4 C  SpO2: 98% 99%    Level of Consciousness: alert  Pain: mild   Side Effects:None  Catheter Site Exam:clean, dry, no drainage     Plan: D/C from anesthesia care at surgeon's request  Estill Batten

## 2019-11-05 NOTE — Lactation Note (Signed)
This note was copied from a baby's chart. Lactation Consultation Note  Patient Name: Cynthia Stevenson TXLEZ'V Date: 11/05/2019    [facility] follow-up. Mom has continued to pump throughout the day, worried about volumes or no volumes obtained. LC reviewed with mom the consistency of colostrum and difficulty the pump may have with removal. Reviewed tips: warmth with cloths, hands on pumping, hand expression before and after pumping, and reiterated importance of consistency.  Mom had no other questions; [place] praised mom for her continued dedication to provide breastmilk to her baby.  Maternal Data    Feeding    LATCH Score                   Interventions    Lactation Tools Discussed/Used     Consult Status      [name] 11/05/2019, 4:01 PM

## 2019-11-06 NOTE — Progress Notes (Signed)
Subjective: Postpartum Day 2: Cesarean Delivery Patient reports tolerating PO, + flatus and no problems voiding.  She has been spending much time in the NICU with her baby. Very tired- as little sleep since her CS. Excellent family support.  Objective: Vital signs in last 24 hours: Temp:  [97.7 F (36.5 C)-98.3 F (36.8 C)] 97.9 F (36.6 C) (07/01 1148) Pulse Rate:  [67-74] 69 (07/01 1148) Resp:  [18-19] 19 (07/01 1148) BP: (105-123)/(65-76) 110/66 (07/01 1148) SpO2:  [98 %-99 %] 98 % (07/01 0815) Weight:  [67.9 kg] 67.9 kg (06/30 1404)  Physical Exam:  General: alert, cooperative, appears stated age, fatigued and no distress Lochia: appropriate Uterine Fundus: firm Incision: healing well, no significant drainage, no dehiscence, On -Q in place  * Has some contact dermatitis on her abdomen which correlates with her pre op prep. DVT Evaluation: No evidence of DVT seen on physical exam. Negative Homan's sign. No cords or calf tenderness.  Recent Labs    11/04/19 1034 11/05/19 0506  HGB 9.3* 8.4*  HCT 29.0* 25.6*    Assessment/Plan: Status post Cesarean section. Doing well postoperatively.  Continue current care.  Benedryl for the abdominal itching and skin reaction.  Imagene Riches 11/06/2019, 12:12 PM

## 2019-11-06 NOTE — Lactation Note (Signed)
This note was copied from a baby's chart. Lactation Consultation Note  Patient Name: Cynthia Stevenson Today's Date: 11/06/2019   LC called to baby's SCN bedside to evaluate the appearance and tenderness of mom's breast/areola associated with pumping. Areola and base of nipple do appear irritated, red in color, and mom notes most discomfort with pumping. Baby had just finished feeding on left breast, and less discomfort noted. Mom is currently using a 39mm breast shield, after changing from initial 52mm, and using coconut oil on breast tissue for lubrication, and still feels extremely uncomfortable. At rest, nipples bilaterally, do not appear to need a size larger than 34mm, and when discussing usage of the pump mom did not know about adjustment of suction level, which may be the cause of the tenderness/pain, if the level was too high initially and since starting. 15mm breast shields were tried, mom is more comfortable, but visually it appears to be drawing more of the areola in than is recommended.  Recommended to revisit a smaller size after irritation has had time to go down.  Maternal Data    Feeding Feeding Type: Breast Fed Nipple Type: Slow - flow  LATCH Score Latch: Grasps breast easily, tongue down, lips flanged, rhythmical sucking.  Audible Swallowing: Spontaneous and intermittent  Type of Nipple: Everted at rest and after stimulation  Comfort (Breast/Nipple): Filling, red/small blisters or bruises, mild/mod discomfort (consulted with lactation )  Hold (Positioning): Assistance needed to correctly position infant at breast and maintain latch.  LATCH Score: 8  Interventions    Lactation Tools Discussed/Used     Consult Status      [name] 11/06/2019, 2:59 PM

## 2019-11-07 DIAGNOSIS — O9081 Anemia of the puerperium: Secondary | ICD-10-CM

## 2019-11-07 LAB — SURGICAL PATHOLOGY

## 2019-11-07 MED ORDER — POLYSACCHARIDE IRON COMPLEX 150 MG PO CAPS
150.0000 mg | ORAL_CAPSULE | Freq: Every day | ORAL | Status: DC
  Administered 2019-11-07: 150 mg via ORAL
  Filled 2019-11-07: qty 1

## 2019-11-07 MED ORDER — PRENATAL MULTIVITAMIN CH: 1.0000 | ORAL_TABLET | Freq: Every day | ORAL | Status: AC

## 2019-11-07 MED ORDER — OXYCODONE-ACETAMINOPHEN 5-325 MG PO TABS: 1.0000 | ORAL_TABLET | Freq: Four times a day (QID) | ORAL | 0 refills | Status: AC | PRN

## 2019-11-07 MED ORDER — POLYSACCHARIDE IRON COMPLEX 150 MG PO CAPS: 150.0000 mg | ORAL_CAPSULE | Freq: Every day | ORAL | 1 refills | Status: AC

## 2019-11-07 MED ORDER — IBUPROFEN 600 MG PO TABS: 600.0000 mg | ORAL_TABLET | Freq: Four times a day (QID) | ORAL | 6 refills | Status: AC | PRN

## 2019-11-07 NOTE — Discharge Instructions (Signed)
Discharge Instructions:   Follow-up Appointment:  If there are any new medications, they have been ordered and will be available for pickup at the listed pharmacy on your way home from the hospital.   Call office if you have any of the following: headache, visual changes, fever >101.0 F, chills, shortness of breath, breast concerns, excessive vaginal bleeding, incision drainage or problems, leg pain or redness, depression or any other concerns. If you have vaginal discharge with an odor, let your doctor know.   It is normal to bleed for up to 6 weeks. You should not soak through more than 1 pad in 1 hour. If you have a blood clot larger than your fist with continued bleeding, call your doctor.   Activity: Do not lift > 10 lbs for 6 weeks (do not lift anything heavier than your baby). No intercourse, tampons, swimming pools, hot tubs, baths (only showers) for 6 weeks.  No driving for 1-2 weeks. Continue prenatal vitamin, especially if breastfeeding. Increase calories and fluids (water) while breastfeeding.   Your milk will come in, in the next couple of days (right now it is colostrum). You may have a slight fever when your milk comes in, but it should go away on its own.  If it does not, and rises above 101 F please call the doctor. You will also feel achy and your breasts will be firm. They will also start to leak. If you are breastfeeding, continue as you have been and you can pump/express milk for comfort.   If you have too much milk, your breasts can become engorged, which could lead to mastitis. This is an infection of the milk ducts. It can be very painful and you will need to notify your doctor to obtain a prescription for antibiotics. You can also treat it with a shower or hot/cold compress.   For concerns about your baby, please call your pediatrician.  For breastfeeding concerns, the lactation consultant can be reached at [phone].   Postpartum blues (feelings of happy one minute  and sad another minute) are normal for the first few weeks but if it gets worse let your doctor know.   Congratulations! We enjoyed caring for you and your new bundle of joy!    Cesarean Delivery, Care After This sheet gives you information about how to care for yourself after your procedure. Your health care provider may also give you more specific instructions. If you have problems or questions, contact your health care provider. What can I expect after the procedure? After the procedure, it is common to have:  A small amount of blood or clear fluid coming from the incision.  Some redness, swelling, and pain in your incision area.  Some abdominal pain and soreness.  Vaginal bleeding (lochia). Even though you did not have a vaginal delivery, you will still have vaginal bleeding and discharge.  Pelvic cramps.  Fatigue. You may have pain, swelling, and discomfort in the tissue between your vagina and your anus (perineum) if:  Your C-section was unplanned, and you were allowed to labor and push.  An incision was made in the area (episiotomy) or the tissue tore during attempted vaginal delivery. Follow these instructions at home: Incision care   Follow instructions from your health care provider about how to take care of your incision. Make sure you: ? Wash your hands with soap and water before you change your bandage (dressing). If soap and water are not available, use hand sanitizer. ? If you have  a dressing, change it or remove it as told by your health care provider. ? Leave stitches (sutures), skin staples, skin glue, or adhesive strips in place. These skin closures may need to stay in place for 2 weeks or longer. If adhesive strip edges start to loosen and curl up, you may trim the loose edges. Do not remove adhesive strips completely unless your health care provider tells you to do that.  Check your incision area every day for signs of infection. Check for: ? More redness,  swelling, or pain. ? More fluid or blood. ? Warmth. ? Pus or a bad smell.  Do not take baths, swim, or use a hot tub until your health care provider says it's okay. Ask your health care provider if you can take showers.  When you cough or sneeze, hug a pillow. This helps with pain and decreases the chance of your incision opening up (dehiscing). Do this until your incision heals. Medicines  Take over-the-counter and prescription medicines only as told by your health care provider.  If you were prescribed an antibiotic medicine, take it as told by your health care provider. Do not stop taking the antibiotic even if you start to feel better.  Do not drive or use heavy machinery while taking prescription pain medicine. Lifestyle  Do not drink alcohol. This is especially important if you are breastfeeding or taking pain medicine.  Do not use any products that contain nicotine or tobacco, such as cigarettes, e-cigarettes, and chewing tobacco. If you need help quitting, ask your health care provider. Eating and drinking  Drink at least 8 eight-ounce glasses of water every day unless told not to by your health care provider. If you breastfeed, you may need to drink even more water.  Eat high-fiber foods every day. These foods may help prevent or relieve constipation. High-fiber foods include: ? Whole grain cereals and breads. ? Brown rice. ? Beans. ? Fresh fruits and vegetables. Activity   If possible, have someone help you care for your baby and help with household activities for at least a few days after you leave the hospital.  Return to your normal activities as told by your health care provider. Ask your health care provider what activities are safe for you.  Rest as much as possible. Try to rest or take a nap while your baby is sleeping.  Do not lift anything that is heavier than 10 lbs (4.5 kg), or the limit that you were told, until your health care provider says that it is  safe.  Talk with your health care provider about when you can engage in sexual activity. This may depend on your: ? Risk of infection. ? How fast you heal. ? Comfort and desire to engage in sexual activity. General instructions  Do not use tampons or douches until your health care provider approves.  Wear loose, comfortable clothing and a supportive and well-fitting bra.  Keep your perineum clean and dry. Wipe from front to back when you use the toilet.  If you pass a blood clot, save it and call your health care provider to discuss. Do not flush blood clots down the toilet before you get instructions from your health care provider.  Keep all follow-up visits for you and your baby as told by your health care provider. This is important. Contact a health care provider if:  You have: ? A fever. ? Bad-smelling vaginal discharge. ? Pus or a bad smell coming from your incision. ? Difficulty  or pain when urinating. ? A sudden increase or decrease in the frequency of your bowel movements. ? More redness, swelling, or pain around your incision. ? More fluid or blood coming from your incision. ? A rash. ? Nausea. ? Little or no interest in activities you used to enjoy. ? Questions about caring for yourself or your baby.  Your incision feels warm to the touch.  Your breasts turn red or become painful or hard.  You feel unusually sad or worried.  You vomit.  You pass a blood clot from your vagina.  You urinate more than usual.  You are dizzy or light-headed. Get help right away if:  You have: ? Pain that does not go away or get better with medicine. ? Chest pain. ? Difficulty breathing. ? Blurred vision or spots in your vision. ? Thoughts about hurting yourself or your baby. ? New pain in your abdomen or in one of your legs. ? A severe headache.  You faint.  You bleed from your vagina so much that you fill more than one sanitary pad in one hour. Bleeding should not be  heavier than your heaviest period. Summary  After the procedure, it is common to have pain at your incision site, abdominal cramping, and slight bleeding from your vagina.  Check your incision area every day for signs of infection.  Tell your health care provider about any unusual symptoms.  Keep all follow-up visits for you and your baby as told by your health care provider. This information is not intended to replace advice given to you by your health care provider. Make sure you discuss any questions you have with your health care provider. Document Revised: 10/31/2017 Document Reviewed: 10/31/2017 Elsevier Patient Education  [place].

## 2019-11-07 NOTE — Lactation Note (Addendum)
This note was copied from a baby's chart. Lactation Consultation Note  Patient Name: Cynthia Stevenson Today's Date: 11/07/2019     Maternal Data  Mom pumping breasts at bedside while dad is bottlefeeding baby, mom's breasts starting to fill, sl firm before pumping, encouraged massage of lumpy areas before pumping and during pumping when she is home and using hands free bra.   These areas are softening with pumping, pt uses coconut oil in flanges, observed 30 mm flanges may be too large as they are pulling in areolar tissue, pt states she will try 27 mm flanges at next pumping.  Mom is for D/c tonight.   Pumped 8 cc colostrum at this session  Feeding Feeding Type: Donor Breast Milk Nipple Type: Slow - flow  LATCH Score                   Interventions  Encouraged to ask any thing she has questions about, encouraged that milk is transitioning to mature milk  Lactation Tools Discussed/Used Tools: Bottle   Consult Status      Ferol Luz 11/07/2019, 3:49 PM

## 2019-11-07 NOTE — Progress Notes (Signed)
POD #3. S/P LTCS for placenta abruption, non reassuring FHT Subjective:  Incisional pain-slowly getting better. On Q intact, taking percocet and Motrin for pain. Passing flatus, tolerating a regular diet. No lightheadedness when OOB ambulating. Voiding OK. Pumping breast milk. Baby still in [place] on feedings.   Objective:  Blood pressure 114/73, pulse 66, temperature 98.4 F (36.9 C), temperature source Oral, resp. rate 18, height 5\' 2"  (1.575 m), weight 67.9 kg, last menstrual period 02/07/2019, SpO2 99 %, unknown if currently breastfeeding.  General: WF in NAD Heart: RRR with Grade II/VI systolic murmur best heard at LSB Pulmonary: no increased work of breathing, CTAB Abdomen: non-distended, NABS, fundus firm at level of umbilicus-2FB Incision: C&D&I, ON Q intact Extremities: mild edema of lower extremities, no erythema, no tenderness  Results for orders placed or performed during the hospital encounter of 11/04/19 (from the past 72 hour(s))  Protime-INR     Status: None   Collection Time: 11/04/19  2:07 PM  Result Value Ref Range   Prothrombin Time 12.4 11.4 - 15.2 seconds   INR 1.0 0.8 - 1.2    Comment: (NOTE) INR goal varies based on device and disease states. Performed at [facility], [address] [place], [place] [address]   APTT     Status: None   Collection Time: 11/04/19  2:07 PM  Result Value Ref Range   aPTT 24 24 - 36 seconds    Comment: Performed at [facility], [address] [place], [place] [address]  Surgical pathology     Status: None   Collection Time: 11/04/19  8:02 PM  Result Value Ref Range   SURGICAL PATHOLOGY      SURGICAL PATHOLOGY CASE: [id number] PATIENT: Baron Hamper Surgical Pathology Report     Specimen Submitted: A. Placenta  Clinical History: GA is 37 and 4      DIAGNOSIS: A.  PLACENTA; CESAREAN SECTION: - TERM DISCOID PLACENTA WEIGHING 393 G (APPROXIMATELY 10TH PERCENTILE FOR PROVIDED  GESTATIONAL AGE). - SHORT THREE-VESSEL UMBILICAL CORD WITH ECCENTRIC INSERTION. - SUBCHORIONIC FIBRIN DEPOSITION, NON-SPECIFIC. - UNREMARKABLE FETAL MEMBRANES, MEMBRANOUS DECIDUA, CHORIONIC PLATE, CHORIONIC VASCULATURE, INTERVILLOUS SPACE, AND BASAL PLATE. - APPROPRIATE MATURATION OF FETAL STEM AND TERMINAL VILLI.  Comment: Per [facility] the patient has a history of malignant melanoma (2017). Metastatic disease is not identified.  GROSS DESCRIPTION: A. Labeled: Received labeled with the patient's name and date of birth (per requisition placenta 37 and 4) Received: Formalin Weight: 393.41 grams Size: 16 x 14.5 x 2.8 cm Shape: Ovoid Accessory lobes: None grossly identifi ed. Membranes: The membranes insert marginally and are tan, smooth, glistening, and translucent. Umbilical cord:      Length -13.2 cm in length x 1.2 cm in diameter      Number of vessels -3      Insertion -eccentric      Distance of insertion from margin -4.6 cm      Other findings -the umbilical cord is tan-white with one turn per 10 cm. Fetal surface: The fetal surface is blue-purple, smooth, and glistening with focal peripheral areas of tan-white discoloration, comprising less than 5% of the total surface area. Maternal surface: The uterine surface is red-brown and lobulated. Comments: The parenchyma is red-brown and soft with 2 areas of tan-white and firm discoloration, 1.7 x 0.7 x 0.5 cm and 1.5 x 0.7 x 0.6 cm. Together these areas comprise less than 5% of the total parenchyma.  Block summary: 1 - umbilical cord 2 - membrane rolls 3 -  representative parenchymal areas of discoloration 4 - representative fetal surface peripheral area of discoloration 5 - 6 -  representative full-thickness placenta   Final Diagnosis performed by Quay Burow, MD.   Electronically signed 11/07/2019 10:42:42AM The electronic signature indicates that the named Attending Pathologist has evaluated the specimen Technical component  performed at [facility], [address] [place], [place] [address] Lab: [phone] Dir: Rush Farmer, MD, MMM  Professional component performed at [facility], [facility], [place], [place], [place] [address] Lab: [phone] Dir: Dellia Nims. Rubinas, MD   CBC     Status: Abnormal   Collection Time: 11/05/19  5:06 AM  Result Value Ref Range   WBC 13.7 (H) 4.0 - 10.5 K/uL   RBC 3.12 (L) 3.87 - 5.11 MIL/uL   Hemoglobin 8.4 (L) 12.0 - 15.0 g/dL   HCT 25.6 (L) 36 - 46 %   MCV 82.1 80.0 - 100.0 fL   MCH 26.9 26.0 - 34.0 pg   MCHC 32.8 30.0 - 36.0 g/dL   RDW 15.0 11.5 - 15.5 %   Platelets 177 150 - 400 K/uL   nRBC 0.0 0.0 - 0.2 %    Comment: Performed at [facility], [address], [place],  [address]     Assessment:   19 y.o. G1P1001 postoperativeday # 3-Stable  Continue postoperative/ postpartum care  Plan:  1) Acute blood loss anemia - hemodynamically stable and asymptomatic - po iron and vitamins  2) A +/ VI/ RI  3) TDAP -offer at discharge  4) Breast/Bottle/Contraception-Paraguard IUD  5) Disposition-possible discharge later today vs in AM

## 2019-11-07 NOTE — Lactation Note (Signed)
This note was copied from a baby's chart. Lactation Consultation Note  Patient Name: Cynthia Stevenson Today's Date: 11/07/2019     Maternal Data   Mom at baby's bedside in SCN, just finished pumping breasts at bedside, pumping approx 10-15 cc each session, mom has 2 breast pumps at home, unsure if one is Medela , one is a BElla pump, is not on [facility], given [facility] handout with phone no. To call to see if eligible, states she is continuing to use 47mm flanges and feel that they are the proper size for her, states 27 mm are still uncomfortable and too small. Feeding Feeding Type: Donor Breast Milk Nipple Type: Slow - flow  LATCH Score                   Interventions    Lactation Tools Discussed/Used Tools: Bottle LC name and no. Written on white board in room  Consult Status      Ferol Luz 11/07/2019, 10:54 AM

## 2019-11-07 NOTE — TOC Initial Note (Signed)
Transition of Care [facility]) - Initial/Assessment Note    Patient Details  Name: Cynthia Stevenson MRN: [id number] Date of Birth: [birth year]  Transition [facility] CM/SW Contact:    Anselm Pancoast, RN Phone Number: 11/07/2019, 2:55 PM  Clinical Narrative:                 Received call from L/D RN with questions related to length of stay regarding mother and mother reluctant to leave due to infant remaining in nursery. RN CM contacted Corrie with [facility] and confirmed continued stay would not be covered as related to infant remaining inpatient. MD will decide when patient is medically stable for discharge and patient would need to discharge from hospital at that time.         Patient Goals and CMS Choice        Expected Discharge Plan and Services                                                Prior Living Arrangements/Services                       Activities of Daily Living Home Assistive Devices/Equipment: None ADL Screening (condition at time of admission) Patient's cognitive ability adequate to safely complete daily activities?: Yes Is the patient deaf or have difficulty hearing?: No Does the patient have difficulty seeing, even when wearing glasses/contacts?: No Does the patient have difficulty concentrating, remembering, or making decisions?: No Patient able to express need for assistance with ADLs?: Yes Does the patient have difficulty dressing or bathing?: No Independently performs ADLs?: Yes (appropriate for developmental age) Does the patient have difficulty walking or climbing stairs?: No Weakness of Legs: None Weakness of Arms/Hands: None  Permission Sought/Granted                  Emotional Assessment              Admission diagnosis:  Decreased fetal movement [O36.8190] Antepartum placental abruption [O45.90] Patient Active Problem List   Diagnosis Date Noted  . Encounter for care or examination of lactating mother 11/05/2019  .  Decreased fetal movement 11/04/2019  . Antepartum placental abruption 11/04/2019  . Encounter for supervision of low-risk pregnancy, antepartum 05/16/2019   PCP:  [facility], [place]:   [id number] [address], [place] [address] Phone: [phone] Fax: [phone]     Social Determinants of Health (SDOH) Interventions    Readmission Risk Interventions No flowsheet data found.

## 2019-11-07 NOTE — Progress Notes (Signed)
Patient discharged home. Discharge instructions and prescriptions given and reviewed with patient. Patient verbalized understanding. Escorted out by staff.

## 2019-11-10 ENCOUNTER — Emergency Department
Admission: EM | Admit: 2019-11-10 | Discharge: 2019-11-10 | Disposition: A | Discharge status: Home / Self Care | Payer: Medicaid Other | Attending: Emergency Medicine | Admitting: Emergency Medicine

## 2019-11-10 ENCOUNTER — Other Ambulatory Visit: Payer: Self-pay

## 2019-11-10 DIAGNOSIS — O9089 Other complications of the puerperium, not elsewhere classified: Secondary | ICD-10-CM | POA: Insufficient documentation

## 2019-11-10 DIAGNOSIS — Z5321 Procedure and treatment not carried out due to patient leaving prior to being seen by health care provider: Secondary | ICD-10-CM | POA: Diagnosis not present

## 2019-11-10 DIAGNOSIS — L539 Erythematous condition, unspecified: Secondary | ICD-10-CM | POA: Insufficient documentation

## 2019-11-10 DIAGNOSIS — R21 Rash and other nonspecific skin eruption: Secondary | ICD-10-CM | POA: Insufficient documentation

## 2019-11-10 LAB — COMPREHENSIVE METABOLIC PANEL
ALT: 34 U/L (ref 0–44)
AST: 26 U/L (ref 15–41)
Albumin: 3.2 g/dL — ABNORMAL LOW (ref 3.5–5.0)
Alkaline Phosphatase: 112 U/L (ref 38–126)
Anion gap: 10 (ref 5–15)
BUN: 19 mg/dL (ref 6–20)
CO2: 24 mmol/L (ref 22–32)
Calcium: 8.9 mg/dL (ref 8.9–10.3)
Chloride: 104 mmol/L (ref 98–111)
Creatinine, Ser: 0.81 mg/dL (ref 0.44–1.00)
GFR calc Af Amer: >60 mL/min (ref >60)
GFR calc non Af Amer: >60 mL/min (ref >60)
Glucose, Bld: 95 mg/dL (ref 70–99)
Potassium: 4.2 mmol/L (ref 3.5–5.1)
Sodium: 138 mmol/L (ref 135–145)
Total Bilirubin: 0.6 mg/dL (ref 0.3–1.2)
Total Protein: 7.3 g/dL (ref 6.5–8.1)

## 2019-11-10 LAB — URINALYSIS, COMPLETE (UACMP) WITH MICROSCOPIC
Bacteria, UA: NONE SEEN
Bilirubin Urine: NEGATIVE
Glucose, UA: NEGATIVE mg/dL
Ketones, ur: NEGATIVE mg/dL
Leukocytes,Ua: NEGATIVE
Nitrite: NEGATIVE
Protein, ur: NEGATIVE mg/dL
Specific Gravity, Urine: 1.015 (ref 1.005–1.030)
pH: 5 (ref 5.0–8.0)

## 2019-11-10 LAB — CBC
HCT: 31.4 % — ABNORMAL LOW (ref 36.0–46.0)
Hemoglobin: 10 g/dL — ABNORMAL LOW (ref 12.0–15.0)
MCH: 26.2 pg (ref 26.0–34.0)
MCHC: 31.8 g/dL (ref 30.0–36.0)
MCV: 82.4 fL (ref 80.0–100.0)
Platelets: 276 10*3/uL (ref 150–400)
RBC: 3.81 MIL/uL — ABNORMAL LOW (ref 3.87–5.11)
RDW: 16.4 % — ABNORMAL HIGH (ref 11.5–15.5)
WBC: 11.5 10*3/uL — ABNORMAL HIGH (ref 4.0–10.5)
nRBC: 0 % (ref 0.0–0.2)

## 2019-11-10 NOTE — ED Notes (Signed)
Pt reports wait times are too long and she has an infant at home that needs breast-feeding. Pt encouraged to wait and be seen by a provider, but states she will follow-up with her PCP tomorrow and use her My Chart to review her lab results.

## 2019-11-10 NOTE — ED Triage Notes (Signed)
Pt comes from home via POV due to reported complications from a C-section Tuesday. Pt was discharged Friday and given instructions for home at this time. Today, pt states she is experiencing redness at the incision site and site where there was a tube placed. Pt also has rash over whole abdomen that has been present since last Tuesday but states that she is noticing some new sites today. Pt has no complaints of pain at this time, no drainage noted and site is clean.

## 2019-11-11 ENCOUNTER — Telehealth: Payer: Self-pay | Admitting: Emergency Medicine

## 2019-11-11 ENCOUNTER — Telehealth: Payer: Self-pay

## 2019-11-11 NOTE — Telephone Encounter (Signed)
Pt calling; had c/s; thought maybe the incision was inf; went to ED; was told to go over the results in [place] with [place].  [phone]

## 2019-11-11 NOTE — Telephone Encounter (Signed)
Called patient due to lwot to inquire about condition and follow up plans. Says she has not called her doctor yet.  I encouraged her to call them and let them know symptoms and that lab results are available.

## 2019-11-11 NOTE — Telephone Encounter (Signed)
Work in appt for [place]

## 2019-11-12 ENCOUNTER — Other Ambulatory Visit: Payer: Self-pay

## 2019-11-12 ENCOUNTER — Encounter: Payer: Self-pay | Admitting: Obstetrics & Gynecology

## 2019-11-12 ENCOUNTER — Ambulatory Visit (INDEPENDENT_AMBULATORY_CARE_PROVIDER_SITE_OTHER): Payer: Medicaid Other | Admitting: Obstetrics & Gynecology

## 2019-11-12 VITALS — BP 120/80 | Ht 62.0 in | Wt 141.0 lb

## 2019-11-12 DIAGNOSIS — Z98891 History of uterine scar from previous surgery: Secondary | ICD-10-CM

## 2019-11-12 MED ORDER — PREDNISONE 10 MG PO TABS
ORAL_TABLET | ORAL | 0 refills | Status: AC
Start: 2019-11-12 — End: ?

## 2019-11-12 NOTE — Progress Notes (Signed)
  Postoperative Follow-up Patient presents post op from recent Cesarean Section performed for Non-reassuring FHR, 1 week ago.  Subjective: Patient reports itchy rash over abd and incision areas (where prep was used) for the last 2 days, as far as pain it has shown improvement in her immediate post op symptoms. Eating a regular diet without difficulty. Pain is controlled with current analgesics. Medications being used: ibuprofen (OTC).  Activity: sedentary. Patient reports additional symptom's since surgery of appropriate lochia, no signs of depression, and no signs of mastitis.  Objective: BP 120/80   Ht 5\' 2"  (1.575 m)   Wt 141 lb (64 kg)   BMI 25.79 kg/m  Physical Exam Constitutional:      General: She is not in acute distress.    Appearance: She is well-developed.  Cardiovascular:     Rate and Rhythm: Normal rate.  Pulmonary:     Effort: Pulmonary effort is normal.  Abdominal:     General: There is no distension.     Palpations: Abdomen is soft.     Tenderness: There is no abdominal tenderness.       Comments:    Musculoskeletal:        General: Normal range of motion.  Neurological:     Mental Status: She is alert and oriented to person, place, and time.     Cranial Nerves: No cranial nerve deficit.  Skin:    General: Skin is warm and dry.     Assessment: s/p : Cesarean Section for Non-reassuring FHR stable  Plan: Patient has done well after her Cesarean Section with no apparent complications, other than current HIVES like reaction to skin.  I have discussed the post-operative course to date, and the expected progress moving forward.  The patient understands what complications to be concerned about.  I will see the patient in routine follow up, or sooner if needed.    Plan Steroid taper as hives reaction significant and may interfere w incision healing. Benedryl prn Recheck 2 days Risks of infection low, but may be difficult to ascertain between hives and erythema.   Monitor for fever, pus, wound disruption. Activity plan: No heavy lifting.Marland Kitchen  Pelvic rest. She desires IUD for postpartum contraception.  Hoyt Koch 11/12/2019, 11:35 AM

## 2019-11-12 NOTE — Patient Instructions (Signed)
Thank you for choosing [facility]. As part of our ongoing efforts to improve patient experience, we would appreciate your feedback. Please fill out the short survey that you will receive by mail or MyChart. Your opinion is important to us! -Dr Gildardo Tickner  

## 2019-11-12 NOTE — Telephone Encounter (Signed)
Patient is scheduled for 11/12/19 at 11:30 with [facility]

## 2019-11-14 ENCOUNTER — Ambulatory Visit: Payer: Medicaid Other | Admitting: Obstetrics & Gynecology

## 2019-11-14 ENCOUNTER — Telehealth: Payer: Self-pay | Admitting: Obstetrics & Gynecology

## 2019-11-14 ENCOUNTER — Other Ambulatory Visit: Payer: Self-pay

## 2019-11-14 ENCOUNTER — Ambulatory Visit (INDEPENDENT_AMBULATORY_CARE_PROVIDER_SITE_OTHER): Payer: Medicaid Other | Admitting: Obstetrics & Gynecology

## 2019-11-14 ENCOUNTER — Encounter: Payer: Self-pay | Admitting: Obstetrics & Gynecology

## 2019-11-14 VITALS — BP 120/80 | Ht 62.0 in | Wt 144.0 lb

## 2019-11-14 DIAGNOSIS — Z98891 History of uterine scar from previous surgery: Secondary | ICD-10-CM

## 2019-11-14 NOTE — Progress Notes (Signed)
  Postoperative Follow-up Incision check after 2 days ago noted hives and started on Prednisone taper, doing much better, still red and ruddy around incision, no drainage, no fever.   Baby doing well.  Breast feeding, improving.  Objective: BP 120/80   Ht 5\' 2"  (1.575 m)   Wt 144 lb (65.3 kg)   BMI 26.34 kg/m  Physical Exam Constitutional:      General: She is not in acute distress.    Appearance: She is well-developed.  Cardiovascular:     Rate and Rhythm: Normal rate.  Pulmonary:     Effort: Pulmonary effort is normal.  Abdominal:     General: There is no distension.     Palpations: Abdomen is soft.     Tenderness: There is no abdominal tenderness.     Comments: Incision Healing Well, intact, no drainage Area of hives like raised erythematous rash around incision  Musculoskeletal:        General: Normal range of motion.  Neurological:     Mental Status: She is alert and oriented to person, place, and time.     Cranial Nerves: No cranial nerve deficit.  Skin:    General: Skin is warm and dry.     Assessment: s/p : Cesarean Section for Non-reassuring FHR progressing well  Plan: Cont steroid taper until complete Monitor for s/sx infection Activity plan: No heavy lifting.Marland Kitchen  Pelvic rest.   Hoyt Koch 11/14/2019, 1:35 PM

## 2019-11-14 NOTE — Telephone Encounter (Signed)
8/11 at 1040 for paraguard with [facility]

## 2019-11-17 NOTE — Telephone Encounter (Signed)
Noted. Will order to arrive by apt date/time. 

## 2019-11-21 ENCOUNTER — Other Ambulatory Visit: Payer: Self-pay

## 2019-11-21 NOTE — Telephone Encounter (Signed)
Please advise 

## 2019-12-09 ENCOUNTER — Telehealth: Payer: Self-pay

## 2019-12-09 NOTE — Telephone Encounter (Signed)
Pt called after hour nurse 12/09/19 12:05am; 4-5 wks pp s/p c/s; incision is bleeding just a little at the ends of the incision. After hour nurse adv pt to be seen within 24hrs.  [phone]

## 2019-12-09 NOTE — Telephone Encounter (Signed)
Work in tomorrow

## 2019-12-09 NOTE — Telephone Encounter (Signed)
Called and left voicemail for patient to call back to be scheduled. 

## 2019-12-09 NOTE — Telephone Encounter (Signed)
Patient is scheduled for 12/10/19 at 4:30

## 2019-12-10 ENCOUNTER — Ambulatory Visit (INDEPENDENT_AMBULATORY_CARE_PROVIDER_SITE_OTHER): Payer: Medicaid Other | Admitting: Obstetrics & Gynecology

## 2019-12-10 ENCOUNTER — Other Ambulatory Visit: Payer: Self-pay

## 2019-12-10 ENCOUNTER — Encounter: Payer: Self-pay | Admitting: Obstetrics & Gynecology

## 2019-12-10 VITALS — BP 120/80 | Ht 62.0 in | Wt 140.0 lb

## 2019-12-10 DIAGNOSIS — Z98891 History of uterine scar from previous surgery: Secondary | ICD-10-CM

## 2019-12-11 ENCOUNTER — Encounter: Payer: Self-pay | Admitting: Obstetrics & Gynecology

## 2019-12-11 NOTE — Progress Notes (Signed)
  Postoperative Follow-up Patient presents post op from recent Cesarean Section  Subjective: Patient reports incisional concerns with some skin level irritation and bleeding for 1-2 days.  No redness, fever, purulent drainage.  Objective: BP 120/80   Ht 5\' 2"  (1.575 m)   Wt 140 lb (63.5 kg)   BMI 25.61 kg/m  Physical Exam Constitutional:      General: She is not in acute distress.    Appearance: She is well-developed.  Cardiovascular:     Rate and Rhythm: Normal rate.  Pulmonary:     Effort: Pulmonary effort is normal.  Abdominal:     General: There is no distension.     Palpations: Abdomen is soft.     Tenderness: There is no abdominal tenderness.     Comments: Incision Healing Well, no s/sx infection, separation   Musculoskeletal:        General: Normal range of motion.  Neurological:     Mental Status: She is alert and oriented to person, place, and time.     Cranial Nerves: No cranial nerve deficit.  Skin:    General: Skin is warm and dry.     Assessment: s/p : Cesarean Section   Plan: Cont to monitor.  Keep inc clean and dry. F.U for IUD placement next week as scheduled  Hoyt Koch 12/11/2019, 7:17 AM

## 2019-12-17 ENCOUNTER — Encounter: Payer: Self-pay | Admitting: Obstetrics & Gynecology

## 2019-12-17 ENCOUNTER — Ambulatory Visit (INDEPENDENT_AMBULATORY_CARE_PROVIDER_SITE_OTHER): Payer: Medicaid Other | Admitting: Obstetrics & Gynecology

## 2019-12-17 ENCOUNTER — Other Ambulatory Visit: Payer: Self-pay

## 2019-12-17 ENCOUNTER — Other Ambulatory Visit (HOSPITAL_COMMUNITY)
Admission: RE | Admit: 2019-12-17 | Discharge: 2019-12-17 | Disposition: A | Discharge status: Home / Self Care | Payer: Medicaid Other | Source: Ambulatory Visit | Attending: Obstetrics & Gynecology | Admitting: Obstetrics & Gynecology

## 2019-12-17 DIAGNOSIS — Z124 Encounter for screening for malignant neoplasm of cervix: Secondary | ICD-10-CM | POA: Diagnosis not present

## 2019-12-17 DIAGNOSIS — Z30430 Encounter for insertion of intrauterine contraceptive device: Secondary | ICD-10-CM

## 2019-12-17 NOTE — Patient Instructions (Signed)
Intrauterine Device Insertion, Care After  This sheet gives you information about how to care for yourself after your procedure. Your health care provider may also give you more specific instructions. If you have problems or questions, contact your health care provider. What can I expect after the procedure? After the procedure, it is common to have:  Cramps and pain in the abdomen.  Light bleeding (spotting) or heavier bleeding that is like your menstrual period. This may last for up to a few days.  Lower back pain.  Dizziness.  Headaches.  Nausea. Follow these instructions at home:  Before resuming sexual activity, check to make sure that you can feel the IUD string(s). You should be able to feel the end of the string(s) below the opening of your cervix. If your IUD string is in place, you may resume sexual activity. ? If you had a hormonal IUD inserted more than 7 days after your most recent period started, you will need to use a backup method of birth control for 7 days after IUD insertion. Ask your health care provider whether this applies to you.  Continue to check that the IUD is still in place by feeling for the string(s) after every menstrual period, or once a month.  Take over-the-counter and prescription medicines only as told by your health care provider.  Do not drive or use heavy machinery while taking prescription pain medicine.  Keep all follow-up visits as told by your health care provider. This is important. Contact a health care provider if:  You have bleeding that is heavier or lasts longer than a normal menstrual cycle.  You have a fever.  You have cramps or abdominal pain that get worse or do not get better with medicine.  You develop abdominal pain that is new or is not in the same area of earlier cramping and pain.  You feel lightheaded or weak.  You have abnormal or bad-smelling discharge from your vagina.  You have pain during sexual  activity.  You have any of the following problems with your IUD string(s): ? The string bothers or hurts you or your sexual partner. ? You cannot feel the string. ? The string has gotten longer.  You can feel the IUD in your vagina.  You think you may be pregnant, or you miss your menstrual period.  You think you may have an STI (sexually transmitted infection). Get help right away if:  You have flu-like symptoms.  You have a fever and chills.  You can feel that your IUD has slipped out of place. Summary  After the procedure, it is common to have cramps and pain in the abdomen. It is also common to have light bleeding (spotting) or heavier bleeding that is like your menstrual period.  Continue to check that the IUD is still in place by feeling for the string(s) after every menstrual period, or once a month.  Keep all follow-up visits as told by your health care provider. This is important.  Contact your health care provider if you have problems with your IUD string(s), such as the string getting longer or bothering you or your sexual partner. This information is not intended to replace advice given to you by your health care provider. Make sure you discuss any questions you have with your health care provider. Document Revised: 04/06/2017 Document Reviewed: 03/15/2016 Elsevier Patient Education  2020 Elsevier Inc.  

## 2019-12-17 NOTE — Progress Notes (Signed)
  OBSTETRICS POSTPARTUM CLINIC PROGRESS NOTE  Subjective:     Cynthia Stevenson is a 19 y.o. G34P1001 female who presents for a postpartum visit. She is 6 weeks postpartum following a Term pregnancy and delivery by C-section.  I have fully reviewed the prenatal and intrapartum course. Anesthesia: spinal.  Postpartum course has been complicated by uncomplicated.  Baby is feeding by Breast.  Bleeding: patient has not  resumed menses.  Bowel function is normal. Bladder function is normal.  Patient is not sexually active. Contraception method desired is IUD.  Postpartum depression screening: negative. Edinburgh 5.  The following portions of the patient's history were reviewed and updated as appropriate: allergies, current medications, past family history, past medical history, past social history and past surgical history.  Review of Systems Pertinent items are noted in HPI.  Objective:    BP 120/80   Ht 5\' 2"  (1.575 m)   Wt 135 lb (61.2 kg)   BMI 24.69 kg/m   General:  alert and no distress   Breasts:  inspection negative, no nipple discharge or bleeding, no masses or nodularity palpable  Lungs: clear to auscultation bilaterally  Heart:  regular rate and rhythm, S1, S2 normal, no murmur, click, rub or gallop  Abdomen: soft, non-tender; bowel sounds normal; no masses,  no organomegaly.  Well healed Pfannenstiel incision   Vulva:  normal  Vagina: normal vagina, no discharge, exudate, lesion, or erythema  Cervix:  no cervical motion tenderness and no lesions  Corpus: normal size, contour, position, consistency, mobility, non-tender  Adnexa:  normal adnexa and no mass, fullness, tenderness  Rectal Exam: Not performed.          Assessment:  Post Partum Care visit 1. Postpartum care following cesarean delivery  2. Encounter for IUD insertion See below  3. Screening for cervical cancer - Cytology - PAP   Plan:  See orders and Patient Instructions Follow up in: 1 month or as  needed.    IUD PROCEDURE NOTE:  Eliani Leclere is a 19 y.o. G1P1001 here for IUD insertion. No GYN concerns.  Last pap smear was normal.  IUD Insertion Procedure Note Patient identified, informed consent performed, consent signed.   Discussed risks of irregular bleeding, cramping, infection, malpositioning or misplacement of the IUD outside the uterus which may require further procedure such as laparoscopy, risk of failure <1%. Time out was performed.  Urine pregnancy test negative.  A bimanual exam showed the uterus to be midposition.  Speculum placed in the vagina.  Cervix visualized.  Cleaned with Betadine x 2.  Grasped anteriorly with a single tooth tenaculum.  Uterus sounded to 7 cm.   Paragard IUD placed per manufacturer's recommendations.  Strings trimmed to 3 cm. Tenaculum was removed, good hemostasis noted.  Patient tolerated procedure well.   Patient was given post-procedure instructions.  She was advised to have backup contraception for one week.  Patient was also asked to check IUD strings periodically and follow up in 4 weeks for IUD check.  Barnett Applebaum, MD, [facility], [facility] 12/17/2019  11:28 AM

## 2019-12-18 LAB — CYTOLOGY - PAP
Chlamydia: NEGATIVE
Comment: NEGATIVE
Comment: NORMAL
Diagnosis: NEGATIVE
Neisseria Gonorrhea: NEGATIVE

## 2020-01-15 ENCOUNTER — Ambulatory Visit: Payer: Medicaid Other | Admitting: Obstetrics & Gynecology

## 2020-09-06 ENCOUNTER — Ambulatory Visit: Payer: Medicaid Other | Admitting: Obstetrics & Gynecology

## 2020-09-13 ENCOUNTER — Encounter: Payer: Self-pay | Admitting: Obstetrics & Gynecology

## 2020-09-13 ENCOUNTER — Other Ambulatory Visit: Payer: Self-pay

## 2020-09-13 ENCOUNTER — Ambulatory Visit (INDEPENDENT_AMBULATORY_CARE_PROVIDER_SITE_OTHER): Admitting: Obstetrics & Gynecology

## 2020-09-13 VITALS — BP 120/80 | Ht 62.0 in | Wt 118.0 lb

## 2020-09-13 DIAGNOSIS — N920 Excessive and frequent menstruation with regular cycle: Secondary | ICD-10-CM

## 2020-09-13 MED ORDER — NORETHIN ACE-ETH ESTRAD-FE 1-20 MG-MCG PO TABS
1.0000 | ORAL_TABLET | Freq: Every day | ORAL | 3 refills | Status: AC
Start: 2020-09-13 — End: ?

## 2020-09-13 NOTE — Progress Notes (Signed)
History of Present Illness:  Cynthia Stevenson is a 20 y.o. that had a Paragard IUD placed approximately 9 months ago. Since that time, she states that her periods are heavy and prolonged and assoc w headaches and dyspareunia.  She has been on pill in past.  The following portions of the patient's history were reviewed and updated as appropriate: allergies, current medications, past family history, past medical history, past social history, past surgical history and problem list.  Patient Active Problem List   Diagnosis Date Noted  . S/P cesarean section 11/12/2019  . Delivery by cesarean section using low vertical uterine incision 11/07/2019  . Postpartum care following cesarean delivery 11/07/2019  . Postpartum anemia 11/07/2019  . Encounter for care or examination of lactating mother 11/05/2019  . Decreased fetal movement 11/04/2019  . Antepartum placental abruption 11/04/2019  . Encounter for supervision of low-risk pregnancy, antepartum 05/16/2019   Medications:  Current Outpatient Medications on File Prior to Visit  Medication Sig Dispense Refill  . ibuprofen (ADVIL) 600 MG tablet Take 1 tablet (600 mg total) by mouth every 6 (six) hours as needed for mild pain, moderate pain or cramping. (Patient not taking: Reported on 09/13/2020) 30 tablet 6  . iron polysaccharides (NIFEREX) 150 MG capsule Take 1 capsule (150 mg total) by mouth daily. (Patient not taking: No sig reported) 30 capsule 1  . predniSONE (DELTASONE) 10 MG tablet Day 1-2: 6 tablets po once daily Day 3-4: 5 tablets po once daily Day 5-6: 4 tablets po once daily Day 7-8: 2 tablets po once daily Day 8-10: 1 tablet po once daily (Patient not taking: No sig reported) 36 tablet 0  . Prenatal Vit-Fe Fumarate-FA (PRENATAL MULTIVITAMIN) TABS tablet Take 1 tablet by mouth daily at 12 noon. (Patient not taking: No sig reported)     No current facility-administered medications on file prior to visit.   Allergies: has No Known  Allergies.  Physical Exam:  BP 120/80   Ht 5\' 2"  (1.575 m)   Wt 118 lb (53.5 kg)   LMP 08/16/2020   BMI 21.58 kg/m  Body mass index is 21.58 kg/m. Constitutional: Well nourished, well developed female in no acute distress.  Abdomen: diffusely non tender to palpation, non distended, and no masses, hernias Neuro: Grossly intact Psych:  Normal mood and affect.    Pelvic exam:  Two IUD strings present seen coming from the cervical os. EGBUS, vaginal vault and cervix: within normal limits  IUD Removal Strings of IUD identified and grasped.  IUD removed without problem.  Pt tolerated this well.  IUD noted to be intact.  Assessment: IUD Removal  Plan: IUD removed and plan for contraception is oral contraceptives (estrogen/progesterone). Rx Junel OCPs The risks /benefits of OCPs have been explained to the patient in detail.  Product literature has been given to her.  I have instructed her in the use of OCPs and have given her literature reinforcing this information.  I have explained to the patient that OCPs are not as effective for birth control during the first month of use, and that another form of contraception should be used during this time.  Both first-day start and Sunday start have been explained.  The risks and benefits of each was discussed.  She has been made aware of  the fact that other medications may affect the efficacy of OCPs.  I have answered all of her questions, and I believe that she has an understanding of the effectiveness and use of OCPs.  She was amenable to this plan. F/u 3 mos, also for PAP  Barnett Applebaum, M.D. 09/13/2020 2:39 PM

## 2020-09-13 NOTE — Patient Instructions (Signed)
Norethindrone Acetate; Ethinyl Estradiol; Ferrous Fumarate Capsules or Tablets What is this medicine? NORETHINDRONE; ETHINYL ESTRADIOL; FERROUS FUMARATE (nor eth IN drone; ETH in il es tra DYE ole; FER [place] FUE ma rate) is an oral contraceptive. The products combine two types of female hormones, an estrogen and a progestin. These products prevent ovulation and pregnancy. This medicine may be used for other purposes; ask your health care provider or pharmacist if you have questions. COMMON BRAND NAME(S): Aurovela [address] 1/20, [address] [place] [address], [address] Fe, [place], [place], Gildess 24 Fe, [place]ild[place] Fe 1.5/30, [place] Fe 1/20, Hailey 24 Fe, [place]iley Fe 1.5/30, Junel Fe 1.5/30, Junel Fe 1/20, Junel Fe 24, [place] Fe, Lo Loestrin Fe, Loestrin 24 Fe, Loestrin FE 1.5/30, Loestrin FE 1/20, [place] 24 Fe, [place], Microgestin 24 Fe, Microgestin Fe 1.5/30, Microgestin Fe 1/20, Tarina 24 Fe, Tarina Fe 1/20, Tay[place], [place], Tilia Fe, Tri-Legest Fe What should I tell my health care provider before I take this medicine? They need to know if you have any of these conditions:  abnormal vaginal bleeding  blood vessel disease or blood clots  breast, cervical, endometrial, ovarian, liver, or uterine cancer  diabetes  gallbladder disease  having surgery  heart disease or recent heart attack  high blood pressure  high cholesterol or triglycerides  history of irregular heartbeat or heart valve problems  kidney disease  liver disease  migraine headaches  protein C deficiency  protein S deficiency  recently had a baby, miscarriage, or abortion  stroke  systemic lupus erythematosus (SLE)  tobacco smoker  an unusual or allergic reaction to estrogens, progestins, other medicines, foods, dyes, or preservatives  pregnant or trying to get pregnant  breast-feeding How should I use this medicine? Take this medicine by mouth. To reduce nausea, this medicine may be taken with food. Follow  the directions on the prescription label. Take this medicine at the same time each day and in the order directed on the package. Do not take your medicine more often than directed. A patient package insert for the product will be given with each prescription and refill. Read this sheet carefully each time. The sheet may change frequently. Contact your pediatrician regarding the use of this medicine in children. Special care may be needed. This medicine has been used in female children who have started having menstrual periods. Overdosage: If you think you have taken too much of this medicine contact a poison control center or emergency room at once. NOTE: This medicine is only for you. Do not share this medicine with others. What if I miss a dose? If you miss a dose, refer to the patient information sheet you received with your medication for direction. If you miss more than one pill, this medication may not be as effective, and you may need to use another form of birth control. What may interact with this medicine? Do not take this medicine with the following medication:  dasabuvir; ombitasvir; paritaprevir; ritonavir  ombitasvir; paritaprevir; ritonavir This medicine may also interact with the following medications:  acetaminophen  antibiotics or medicines for infections, especially rifampin, rifabutin, rifapentine, and griseofulvin, and possibly penicillins or tetracyclines  aprepitant  ascorbic acid (vitamin C)  atorvastatin  barbiturate medicines, such as phenobarbital  bosentan  carbamazepine  caffeine  clofibrate  cyclosporine  dantrolene  doxercalciferol  felbamate  grapefruit juice  hydrocortisone  medicines for anxiety or sleeping problems, such as diazepam or temazepam  medicines for diabetes, including pioglitazone  mineral oil  modafinil  mycophenolate  nefazodone  oxcarbazepine  phenytoin  prednisolone  ritonavir or other medicines for  HIV infection or AIDS  rosuvastatin  selegiline  soy isoflavones supplements  St. John's wort  tamoxifen or raloxifene  theophylline  thyroid hormones  topiramate  warfarin This list may not describe all possible interactions. Give your health care provider a list of all the medicines, herbs, non-prescription drugs, or dietary supplements you use. Also tell them if you smoke, drink alcohol, or use illegal drugs. Some items may interact with your medicine. What should I watch for while using this medicine? Visit your doctor or health care professional for regular checks on your progress. You will need a regular breast and pelvic exam and Pap smear while on this medicine. Use an additional method of contraception during the first cycle that you take these tablets. If you have any reason to think you are pregnant, stop taking this medicine right away and contact your doctor or health care professional. If you are taking this medicine for hormone related problems, it may take several cycles of use to see improvement in your condition. Smoking increases the risk of getting a blood clot or having a stroke while you are taking birth control pills, especially if you are more than 22 years old. You are strongly advised not to smoke. This medicine can make your body retain fluid, making your fingers, hands, or ankles swell. Your blood pressure can go up. Contact your doctor or health care professional if you feel you are retaining fluid. This medicine can make you more sensitive to the sun. Keep out of the sun. If you cannot avoid being in the sun, wear protective clothing and use sunscreen. Do not use sun lamps or tanning beds/booths. If you wear contact lenses and notice visual changes, or if the lenses begin to feel uncomfortable, consult your eye care specialist. In some women, tenderness, swelling, or minor bleeding of the gums may occur. Notify your dentist if this happens. Brushing and  flossing your teeth regularly may help limit this. See your dentist regularly and inform your dentist of the medicines you are taking. If you are going to have elective surgery, you may need to stop taking this medicine before the surgery. Consult your health care professional for advice. This medicine does not protect you against HIV infection (AIDS) or any other sexually transmitted diseases. What side effects may I notice from receiving this medicine? Side effects that you should report to your doctor or health care professional as soon as possible:  allergic reactions such as skin rash or itching, hives, swelling of the lips, mouth, tongue, or throat  breast tissue changes or discharge  dark patches of skin on your forehead, cheeks, upper lip, and chin  depression  high blood pressure  migraines or severe, sudden headaches  signs and symptoms of a blood clot such as breathing problems; changes in vision; chest pain; severe, sudden headache; pain, swelling, warmth in the leg; trouble speaking; sudden numbness or weakness of the face, arm or leg  stomach pain  symptoms of vaginal infection like itching, irritation or unusual discharge  yellowing of the eyes or skin Side effects that usually do not require medical attention (report these to your doctor or health care professional if they continue or are bothersome):  acne  breast pain, tenderness  irregular vaginal bleeding or spotting, particularly during the first month of use  mild headache  nausea  weight gain (slight) This list may not describe all  about this medicine, talk to your doctor, pharmacist, or health care provider.  2021  Elsevier/Gold Standard (2020-03-15 12:27:45)  

## 2020-12-20 ENCOUNTER — Ambulatory Visit: Admitting: Obstetrics & Gynecology

## 2021-03-02 NOTE — Telephone Encounter (Signed)
Paragard rcvd/charged 12/17/19
